# Patient Record
Sex: Female | Born: 1980 | Race: Black or African American | Hispanic: No | Marital: Single | Smoking: Current every day smoker
Health system: Southern US, Community
[De-identification: ages and names within clinical notes are randomized; demographics above are authoritative.]

## PROBLEM LIST (undated history)

## (undated) DIAGNOSIS — E7800 Pure hypercholesterolemia, unspecified: Secondary | ICD-10-CM

---

## 2008-06-06 ENCOUNTER — Encounter (HOSPITAL_BASED_OUTPATIENT_CLINIC_OR_DEPARTMENT_OTHER): Payer: Medicaid Other | Admitting: Physical Medicine & Rehabilitation

## 2008-06-06 DIAGNOSIS — M545 Low back pain, unspecified: Secondary | ICD-10-CM

## 2008-06-20 ENCOUNTER — Encounter (HOSPITAL_BASED_OUTPATIENT_CLINIC_OR_DEPARTMENT_OTHER): Payer: Medicaid Other | Admitting: Rehabilitative and Restorative Service Providers"

## 2008-06-28 ENCOUNTER — Encounter (HOSPITAL_BASED_OUTPATIENT_CLINIC_OR_DEPARTMENT_OTHER): Payer: Medicaid Other | Admitting: Rehabilitative and Restorative Service Providers"

## 2008-07-07 ENCOUNTER — Encounter (HOSPITAL_BASED_OUTPATIENT_CLINIC_OR_DEPARTMENT_OTHER): Payer: Medicaid Other | Admitting: Rehabilitative and Restorative Service Providers"

## 2008-07-12 ENCOUNTER — Encounter (HOSPITAL_BASED_OUTPATIENT_CLINIC_OR_DEPARTMENT_OTHER): Payer: Medicaid Other | Admitting: Rehabilitative and Restorative Service Providers"

## 2008-07-17 ENCOUNTER — Encounter (HOSPITAL_BASED_OUTPATIENT_CLINIC_OR_DEPARTMENT_OTHER): Payer: Medicaid Other | Admitting: Rehabilitative and Restorative Service Providers"

## 2008-07-26 ENCOUNTER — Encounter (HOSPITAL_BASED_OUTPATIENT_CLINIC_OR_DEPARTMENT_OTHER): Payer: Medicaid Other | Admitting: Rehabilitative and Restorative Service Providers"

## 2008-08-01 ENCOUNTER — Encounter (HOSPITAL_BASED_OUTPATIENT_CLINIC_OR_DEPARTMENT_OTHER): Payer: Medicaid Other | Admitting: Rehabilitative and Restorative Service Providers"

## 2008-08-01 ENCOUNTER — Encounter (HOSPITAL_BASED_OUTPATIENT_CLINIC_OR_DEPARTMENT_OTHER): Payer: Medicaid Other | Admitting: Physical Medicine & Rehabilitation

## 2008-08-01 DIAGNOSIS — M545 Low back pain, unspecified: Secondary | ICD-10-CM

## 2008-08-02 ENCOUNTER — Encounter (HOSPITAL_BASED_OUTPATIENT_CLINIC_OR_DEPARTMENT_OTHER): Payer: Self-pay | Admitting: Rehabilitative and Restorative Service Providers"

## 2008-08-09 ENCOUNTER — Encounter (HOSPITAL_BASED_OUTPATIENT_CLINIC_OR_DEPARTMENT_OTHER): Payer: Medicaid Other | Admitting: Rehabilitative and Restorative Service Providers"

## 2008-09-26 ENCOUNTER — Encounter (HOSPITAL_BASED_OUTPATIENT_CLINIC_OR_DEPARTMENT_OTHER): Payer: Medicaid Other | Admitting: Physical Medicine & Rehabilitation

## 2008-09-26 DIAGNOSIS — M545 Low back pain, unspecified: Secondary | ICD-10-CM

## 2011-10-05 ENCOUNTER — Emergency Department: Payer: Self-pay | Admitting: *Deleted

## 2012-12-31 ENCOUNTER — Emergency Department: Payer: Self-pay | Admitting: Emergency Medicine

## 2012-12-31 LAB — URINALYSIS, COMPLETE
Bilirubin,UR: NEGATIVE
Glucose,UR: NEGATIVE mg/dL (ref 0–75)
Ketone: NEGATIVE
Nitrite: NEGATIVE
RBC,UR: 2 /HPF (ref 0–5)
Specific Gravity: 1.002 (ref 1.003–1.030)

## 2013-01-04 ENCOUNTER — Emergency Department: Payer: Self-pay | Admitting: Emergency Medicine

## 2013-02-16 ENCOUNTER — Ambulatory Visit: Payer: Self-pay | Admitting: Obstetrics & Gynecology

## 2013-02-16 LAB — HEMOGLOBIN: HGB: 13 g/dL (ref 12.0–16.0)

## 2013-02-22 ENCOUNTER — Ambulatory Visit: Payer: Self-pay | Admitting: Obstetrics & Gynecology

## 2013-02-24 LAB — PATHOLOGY REPORT

## 2013-03-06 ENCOUNTER — Emergency Department: Payer: Self-pay | Admitting: Emergency Medicine

## 2013-03-06 LAB — URINALYSIS, COMPLETE
Bilirubin,UR: NEGATIVE
Nitrite: NEGATIVE
Ph: 7 (ref 4.5–8.0)
Protein: NEGATIVE
RBC,UR: 19 /HPF (ref 0–5)
Specific Gravity: 1.011 (ref 1.003–1.030)
Squamous Epithelial: 13
WBC UR: 3 /HPF (ref 0–5)

## 2013-03-06 LAB — PREGNANCY, URINE: Pregnancy Test, Urine: NEGATIVE m[IU]/mL

## 2013-03-06 LAB — COMPREHENSIVE METABOLIC PANEL
Alkaline Phosphatase: 95 U/L (ref 50–136)
BUN: 7 mg/dL (ref 7–18)
Co2: 27 mmol/L (ref 21–32)
Glucose: 103 mg/dL — ABNORMAL HIGH (ref 65–99)
SGPT (ALT): 35 U/L (ref 12–78)
Total Protein: 8.1 g/dL (ref 6.4–8.2)

## 2013-03-06 LAB — DRUG SCREEN, URINE
Amphetamines, Ur Screen: NEGATIVE (ref ?–1000)
Barbiturates, Ur Screen: NEGATIVE (ref ?–200)
Benzodiazepine, Ur Scrn: NEGATIVE (ref ?–200)
Cannabinoid 50 Ng, Ur ~~LOC~~: NEGATIVE (ref ?–50)
Cocaine Metabolite,Ur ~~LOC~~: NEGATIVE (ref ?–300)
Phencyclidine (PCP) Ur S: NEGATIVE (ref ?–25)
Tricyclic, Ur Screen: NEGATIVE (ref ?–1000)

## 2013-03-06 LAB — CBC
HCT: 40.3 % (ref 35.0–47.0)
HGB: 13.5 g/dL (ref 12.0–16.0)
MCH: 28.3 pg (ref 26.0–34.0)
MCHC: 33.5 g/dL (ref 32.0–36.0)
MCV: 84 fL (ref 80–100)
RDW: 13.9 % (ref 11.5–14.5)

## 2013-03-06 LAB — ETHANOL
Ethanol %: <0.003 % (ref 0.000–0.080)
Ethanol: <3 mg/dL

## 2013-03-06 LAB — TSH: Thyroid Stimulating Horm: 1.4 u[IU]/mL

## 2013-04-10 ENCOUNTER — Inpatient Hospital Stay: Payer: Self-pay | Admitting: Internal Medicine

## 2013-04-10 LAB — COMPREHENSIVE METABOLIC PANEL
Alkaline Phosphatase: 97 U/L (ref 50–136)
Anion Gap: 11 (ref 7–16)
BUN: 10 mg/dL (ref 7–18)
Bilirubin,Total: 0.6 mg/dL (ref 0.2–1.0)
Chloride: 101 mmol/L (ref 98–107)
Co2: 20 mmol/L — ABNORMAL LOW (ref 21–32)
Creatinine: 1.24 mg/dL (ref 0.60–1.30)
Osmolality: 265 (ref 275–301)
Potassium: 3.7 mmol/L (ref 3.5–5.1)
SGOT(AST): 18 U/L (ref 15–37)
SGPT (ALT): 31 U/L (ref 12–78)
Sodium: 132 mmol/L — ABNORMAL LOW (ref 136–145)
Total Protein: 8.3 g/dL — ABNORMAL HIGH (ref 6.4–8.2)

## 2013-04-10 LAB — DIFFERENTIAL
Basophil #: 0 10*3/uL (ref 0.0–0.1)
Basophil %: 0.2 %
Eosinophil #: 0.3 10*3/uL (ref 0.0–0.7)
Eosinophil %: 1.3 %
Lymphocyte #: 0.6 10*3/uL — ABNORMAL LOW (ref 1.0–3.6)
Lymphocyte %: 2.6 %
Monocyte #: 0.6 x10 3/mm (ref 0.2–0.9)

## 2013-04-10 LAB — URINALYSIS, COMPLETE
Bilirubin,UR: NEGATIVE
Glucose,UR: NEGATIVE mg/dL (ref 0–75)
Ketone: NEGATIVE
Nitrite: NEGATIVE
Ph: 7 (ref 4.5–8.0)
Protein: NEGATIVE
RBC,UR: 3 /HPF (ref 0–5)
Specific Gravity: 1.008 (ref 1.003–1.030)
Squamous Epithelial: 19
WBC UR: 1 /HPF (ref 0–5)

## 2013-04-10 LAB — LIPASE, BLOOD: Lipase: 272 U/L (ref 73–393)

## 2013-04-10 LAB — SEDIMENTATION RATE: Erythrocyte Sed Rate: 9 mm/hr (ref 0–20)

## 2013-04-10 LAB — WET PREP, GENITAL

## 2013-04-10 LAB — CK TOTAL AND CKMB (NOT AT ARMC)
CK, Total: 116 U/L (ref 21–215)
CK-MB: <0.5 ng/mL — ABNORMAL LOW (ref 0.5–3.6)

## 2013-04-10 LAB — CBC
HCT: 42.2 % (ref 35.0–47.0)
MCH: 27.6 pg (ref 26.0–34.0)
RDW: 13.7 % (ref 11.5–14.5)
WBC: 23.6 10*3/uL — ABNORMAL HIGH (ref 3.6–11.0)

## 2013-04-11 LAB — BASIC METABOLIC PANEL
Anion Gap: 10 (ref 7–16)
BUN: 9 mg/dL (ref 7–18)
Calcium, Total: 8.4 mg/dL — ABNORMAL LOW (ref 8.5–10.1)
Chloride: 109 mmol/L — ABNORMAL HIGH (ref 98–107)
Co2: 19 mmol/L — ABNORMAL LOW (ref 21–32)
Creatinine: 0.88 mg/dL (ref 0.60–1.30)
EGFR (African American): >60
Glucose: 162 mg/dL — ABNORMAL HIGH (ref 65–99)
Sodium: 138 mmol/L (ref 136–145)

## 2013-04-11 LAB — CBC WITH DIFFERENTIAL/PLATELET
Basophil #: 0.1 10*3/uL (ref 0.0–0.1)
Eosinophil #: 0.1 10*3/uL (ref 0.0–0.7)
Eosinophil %: 1 %
HCT: 36.6 % (ref 35.0–47.0)
Lymphocyte #: 0.4 10*3/uL — ABNORMAL LOW (ref 1.0–3.6)
Lymphocyte %: 3.7 %
MCH: 27.7 pg (ref 26.0–34.0)
MCHC: 34.1 g/dL (ref 32.0–36.0)
Monocyte #: 0.1 x10 3/mm — ABNORMAL LOW (ref 0.2–0.9)
Neutrophil %: 93.6 %
Platelet: 209 10*3/uL (ref 150–440)
WBC: 11.3 10*3/uL — ABNORMAL HIGH (ref 3.6–11.0)

## 2013-04-12 LAB — URINE CULTURE

## 2013-04-16 LAB — CULTURE, BLOOD (SINGLE)

## 2013-07-10 IMAGING — CT CT ABD-PELV W/ CM
1 of 2 series · 15 of 32 positions shown, 19 images · IV contrast (isovue)
Comparison: None

REASON FOR EXAM: (1) RLQ tenderness to palp; (2) RLQ tenderness to palp
COMMENTS:

PROCEDURE:     CT  - CT ABDOMEN / PELVIS  W  - April 10, 2013  [DATE]
RESULT:     History: Right lower quadrant tenderness
TECHNIQUE: Multiple axial images of the abdomen and pelvis were performed
from the lung bases to the pubic symphysis, without p.o. contrast and with
100 ml of Isovue 300 intravenous contrast.

[Series 2: 3mm soft tissue · axial · 0.66mm/px · z∈[-1050,-654]mm · 15 of 144 slices shown, 19 images]
[im 6/144  soft-tissue]
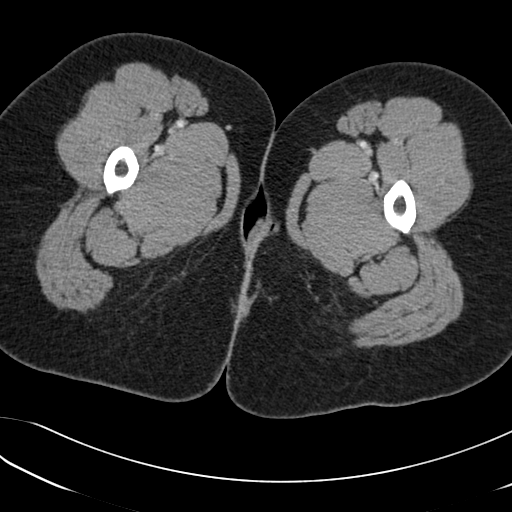
[im 6/144  bone]
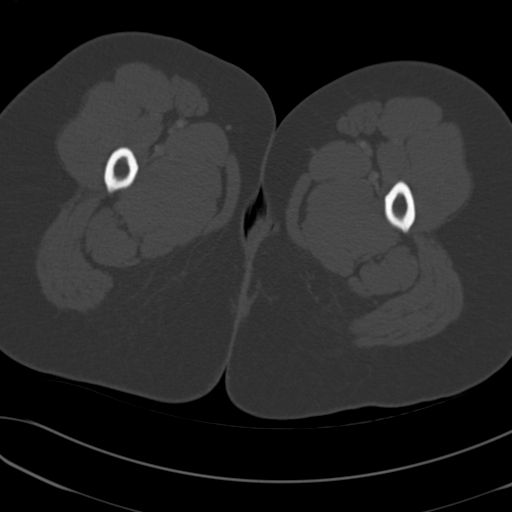
[im 18/144  soft-tissue]
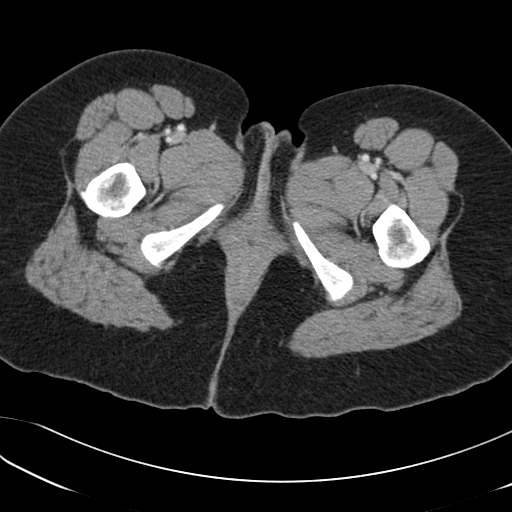
[im 30/144  soft-tissue]
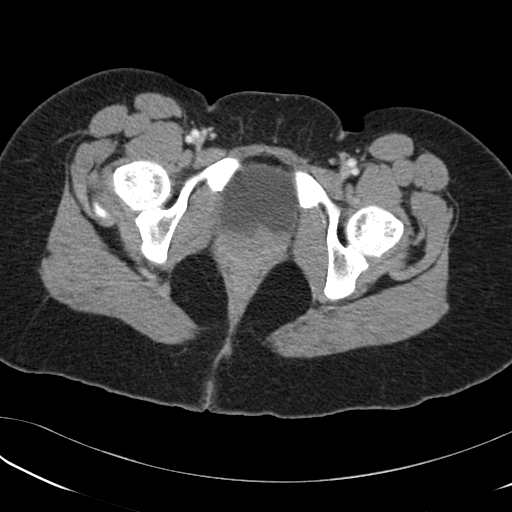
[im 42/144  soft-tissue]
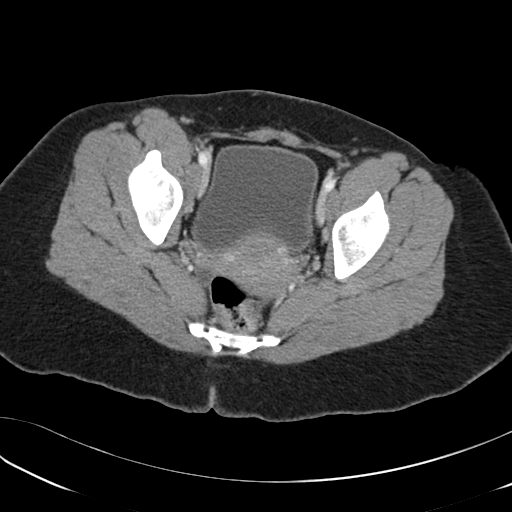
[im 48/144  soft-tissue]
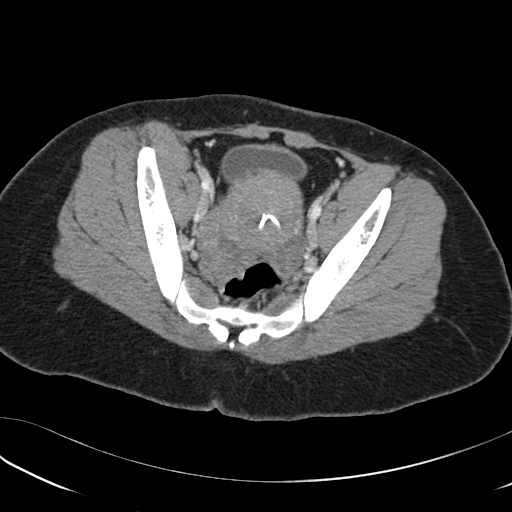
[im 60/144  soft-tissue]
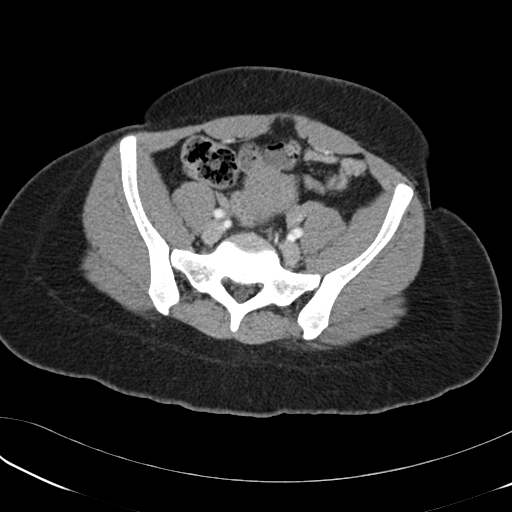
[im 72/144  soft-tissue]
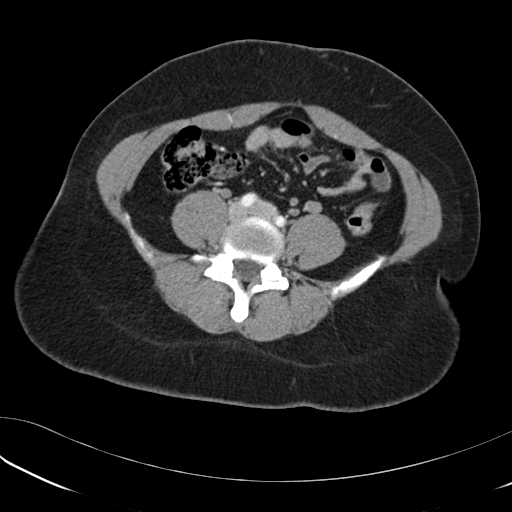
[im 84/144  soft-tissue]
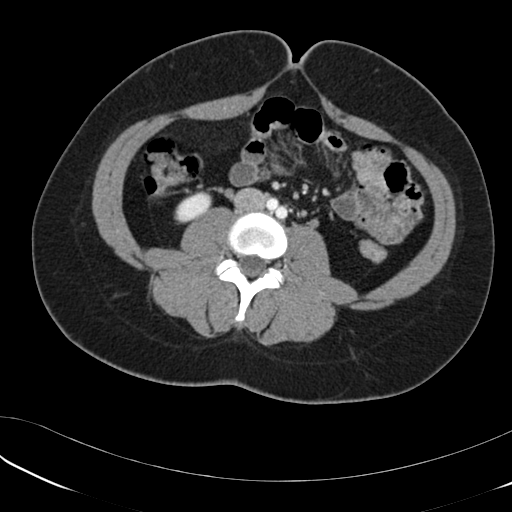
[im 96/144  soft-tissue]
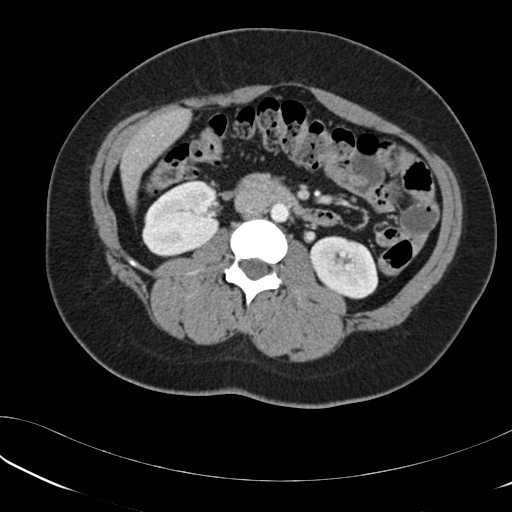
[im 96/144  bone]
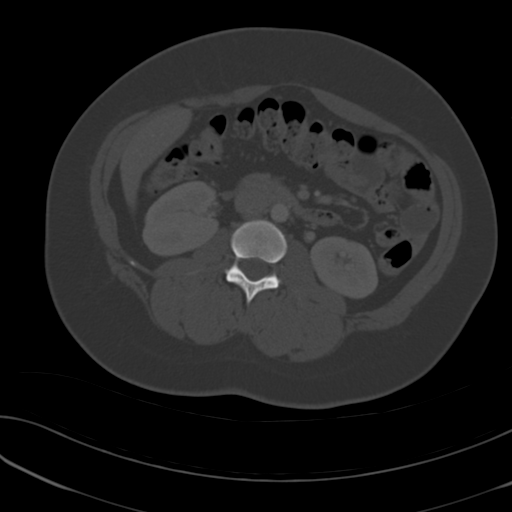
[im 102/144  soft-tissue]
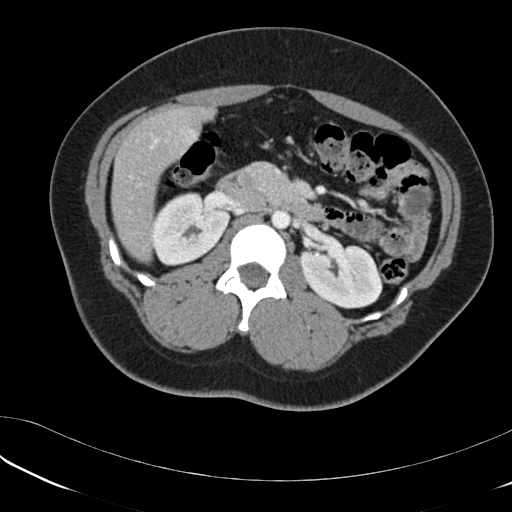
[im 114/144  soft-tissue]
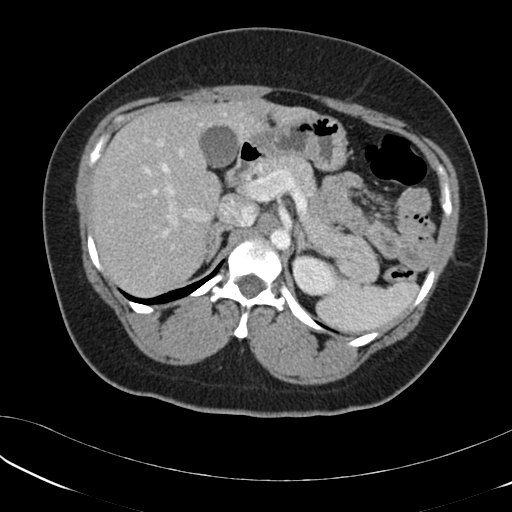
[im 120/144  lung]
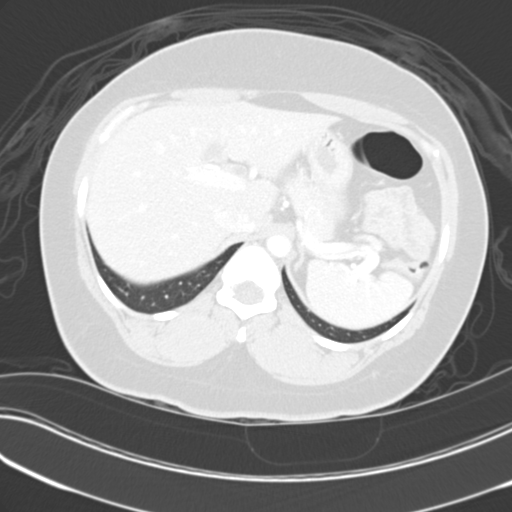
[im 126/144  soft-tissue]
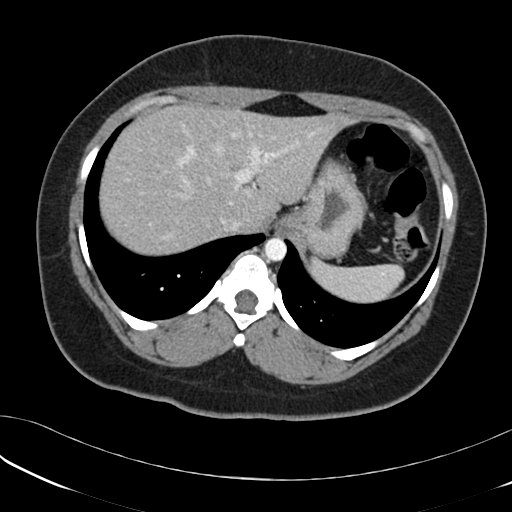
[im 126/144  lung]
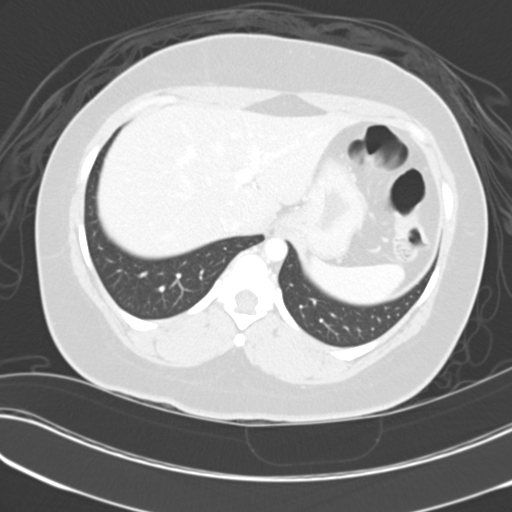
[im 132/144  lung]
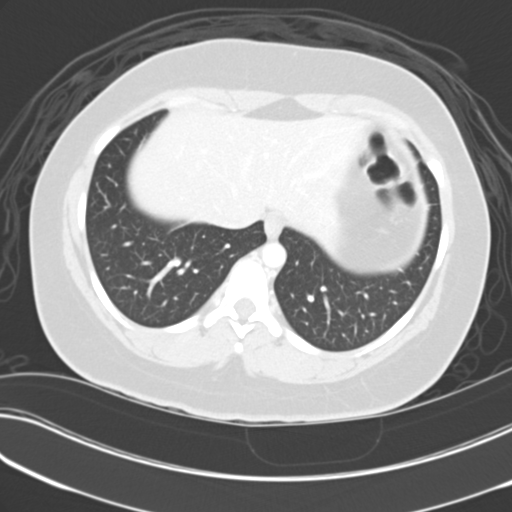
[im 138/144  soft-tissue]
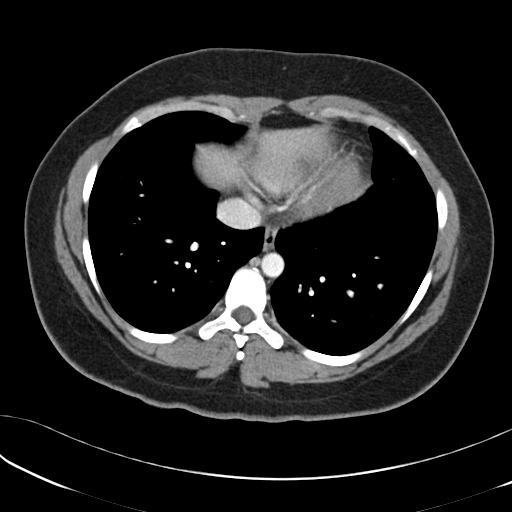
[im 138/144  lung]
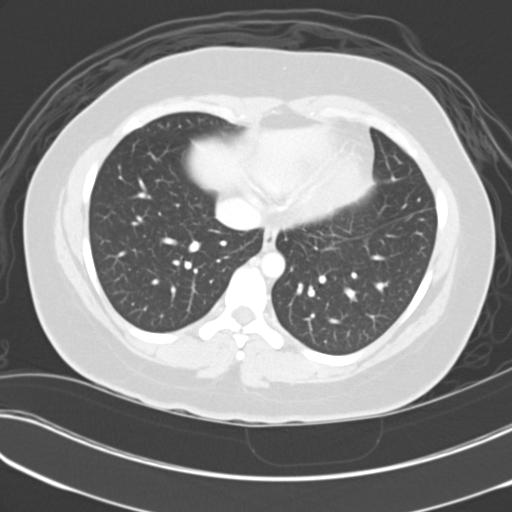

[15 of 32 positions shown; findings below may reference images not displayed]

FINDINGS: The lung bases are clear. There is no pneumothorax. The heart size is
normal.

The liver demonstrates no focal abnormality. There is no intrahepatic or
extrahepatic biliary ductal dilatation. The gallbladder is unremarkable. The
spleen demonstrates no focal abnormality. The kidneys, adrenal glands, and
pancreas are normal. The bladder is unremarkable.

The stomach, duodenum, small intestine, and large intestine demonstrate no
dilatation. There is a normal caliber appendix in the right lower quadrant
without periappendiceal inflammatory changes. There is no pneumoperitoneum,
pneumatosis, or portal venous gas. There is trace amount of pelvic free
fluid likely physiologic. There is no lymphadenopathy.

The abdominal aorta is normal in caliber .

The osseous structures are unremarkable.
IMPRESSION: 1. Normal Appendix.

[REDACTED]

## 2013-10-12 ENCOUNTER — Encounter (HOSPITAL_COMMUNITY): Payer: Self-pay | Admitting: Emergency Medicine

## 2013-10-12 ENCOUNTER — Emergency Department (HOSPITAL_COMMUNITY)
Admission: EM | Admit: 2013-10-12 | Discharge: 2013-10-13 | Disposition: A | Discharge status: Home / Self Care | Payer: Medicaid Other | Attending: Emergency Medicine | Admitting: Emergency Medicine

## 2013-10-12 ENCOUNTER — Emergency Department (HOSPITAL_COMMUNITY): Payer: Medicaid Other

## 2013-10-12 DIAGNOSIS — S3981XA Other specified injuries of abdomen, initial encounter: Secondary | ICD-10-CM | POA: Insufficient documentation

## 2013-10-12 DIAGNOSIS — Z862 Personal history of diseases of the blood and blood-forming organs and certain disorders involving the immune mechanism: Secondary | ICD-10-CM | POA: Insufficient documentation

## 2013-10-12 DIAGNOSIS — S0990XA Unspecified injury of head, initial encounter: Secondary | ICD-10-CM | POA: Insufficient documentation

## 2013-10-12 DIAGNOSIS — S46909A Unspecified injury of unspecified muscle, fascia and tendon at shoulder and upper arm level, unspecified arm, initial encounter: Secondary | ICD-10-CM | POA: Insufficient documentation

## 2013-10-12 DIAGNOSIS — T7421XA Adult sexual abuse, confirmed, initial encounter: Secondary | ICD-10-CM | POA: Insufficient documentation

## 2013-10-12 DIAGNOSIS — J029 Acute pharyngitis, unspecified: Secondary | ICD-10-CM | POA: Insufficient documentation

## 2013-10-12 DIAGNOSIS — IMO0002 Reserved for concepts with insufficient information to code with codable children: Secondary | ICD-10-CM

## 2013-10-12 DIAGNOSIS — R300 Dysuria: Secondary | ICD-10-CM | POA: Insufficient documentation

## 2013-10-12 DIAGNOSIS — F17200 Nicotine dependence, unspecified, uncomplicated: Secondary | ICD-10-CM | POA: Insufficient documentation

## 2013-10-12 DIAGNOSIS — S0993XA Unspecified injury of face, initial encounter: Secondary | ICD-10-CM | POA: Insufficient documentation

## 2013-10-12 DIAGNOSIS — Z79899 Other long term (current) drug therapy: Secondary | ICD-10-CM | POA: Insufficient documentation

## 2013-10-12 DIAGNOSIS — Z3202 Encounter for pregnancy test, result negative: Secondary | ICD-10-CM | POA: Insufficient documentation

## 2013-10-12 DIAGNOSIS — S4980XA Other specified injuries of shoulder and upper arm, unspecified arm, initial encounter: Secondary | ICD-10-CM | POA: Insufficient documentation

## 2013-10-12 DIAGNOSIS — Z8639 Personal history of other endocrine, nutritional and metabolic disease: Secondary | ICD-10-CM | POA: Insufficient documentation

## 2013-10-12 HISTORY — DX: Pure hypercholesterolemia, unspecified: E78.00
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
## 2013-10-12 NOTE — ED Notes (Signed)
All clothing taken by detective,pt advocate at bedside.

## 2013-10-13 LAB — POCT PREGNANCY, URINE: Preg Test, Ur: NEGATIVE

## 2013-10-13 MED ORDER — METRONIDAZOLE 500 MG PO TABS
2000.0000 mg | ORAL_TABLET | Freq: Once | ORAL | Status: AC
  Administered 2013-10-13: 2000 mg via ORAL

## 2013-10-13 MED ORDER — LEVONORGESTREL 0.75 MG PO TABS
1.5000 mg | ORAL_TABLET | Freq: Once | ORAL | Status: AC
  Administered 2013-10-13: 1.5 mg via ORAL
  Filled 2013-10-13: qty 2

## 2013-10-13 MED ORDER — CEFIXIME 400 MG PO TABS
400.0000 mg | ORAL_TABLET | Freq: Once | ORAL | Status: AC
  Administered 2013-10-13: 400 mg via ORAL
  Filled 2013-10-13: qty 1

## 2013-10-13 MED ORDER — PROMETHAZINE HCL 25 MG PO TABS
25.0000 mg | ORAL_TABLET | Freq: Four times a day (QID) | ORAL | Status: DC | PRN
  Administered 2013-10-13: 75 mg via ORAL
  Administered 2013-10-13: 25 mg via ORAL
  Filled 2013-10-13: qty 1

## 2013-10-13 MED ORDER — AZITHROMYCIN 1 G PO PACK
1.0000 g | PACK | Freq: Once | ORAL | Status: AC
  Administered 2013-10-13: 1 g via ORAL

## 2013-10-13 NOTE — ED Provider Notes (Signed)
Medical screening examination/treatment/procedure(s) were performed by non-physician practitioner and as supervising physician I was immediately available for consultation/collaboration.  EKG Interpretation   None        Shon Baton, MD 10/13/13 [phone]

## 2013-10-13 NOTE — ED Notes (Addendum)
Sane Nurse at bedside now and will be transporting  patient upstair now.
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
## 2013-10-14 ENCOUNTER — Emergency Department (HOSPITAL_COMMUNITY)
Admission: EM | Admit: 2013-10-14 | Discharge: 2013-10-14 | Disposition: A | Discharge status: Home / Self Care | Payer: Medicaid Other | Attending: Emergency Medicine | Admitting: Emergency Medicine

## 2013-10-14 ENCOUNTER — Encounter (HOSPITAL_COMMUNITY): Payer: Self-pay | Admitting: Emergency Medicine

## 2013-10-14 DIAGNOSIS — Z888 Allergy status to other drugs, medicaments and biological substances status: Secondary | ICD-10-CM | POA: Insufficient documentation

## 2013-10-14 DIAGNOSIS — E7800 Pure hypercholesterolemia, unspecified: Secondary | ICD-10-CM | POA: Insufficient documentation

## 2013-10-14 DIAGNOSIS — F17200 Nicotine dependence, unspecified, uncomplicated: Secondary | ICD-10-CM | POA: Insufficient documentation

## 2013-10-14 DIAGNOSIS — Z3202 Encounter for pregnancy test, result negative: Secondary | ICD-10-CM | POA: Insufficient documentation

## 2013-10-14 DIAGNOSIS — N390 Urinary tract infection, site not specified: Secondary | ICD-10-CM | POA: Insufficient documentation

## 2013-10-14 DIAGNOSIS — Z79899 Other long term (current) drug therapy: Secondary | ICD-10-CM | POA: Insufficient documentation

## 2013-10-14 LAB — COMPREHENSIVE METABOLIC PANEL
ALT: 40 U/L — ABNORMAL HIGH (ref 0–35)
AST: 35 U/L (ref 0–37)
CO2: 26 mEq/L (ref 19–32)
Chloride: 100 mEq/L (ref 96–112)
Creatinine, Ser: 0.87 mg/dL (ref 0.50–1.10)
GFR calc non Af Amer: 88 mL/min — ABNORMAL LOW (ref >90)
Glucose, Bld: 93 mg/dL (ref 70–99)
Sodium: 137 mEq/L (ref 135–145)
Total Bilirubin: 0.2 mg/dL — ABNORMAL LOW (ref 0.3–1.2)

## 2013-10-14 LAB — CBC
MCH: 28.9 pg (ref 26.0–34.0)
MCHC: 34.7 g/dL (ref 30.0–36.0)
Platelets: 263 10*3/uL (ref 150–400)

## 2013-10-14 LAB — URINALYSIS, ROUTINE W REFLEX MICROSCOPIC
Bilirubin Urine: NEGATIVE
Glucose, UA: NEGATIVE mg/dL
Ketones, ur: NEGATIVE mg/dL
Specific Gravity, Urine: 1.012 (ref 1.005–1.030)
pH: 7.5 (ref 5.0–8.0)

## 2013-10-14 LAB — URINE MICROSCOPIC-ADD ON

## 2013-10-14 LAB — POCT PREGNANCY, URINE: Preg Test, Ur: NEGATIVE

## 2013-10-14 MED ORDER — CEPHALEXIN 250 MG PO CAPS
500.0000 mg | ORAL_CAPSULE | Freq: Once | ORAL | Status: AC
  Administered 2013-10-14: 500 mg via ORAL
  Filled 2013-10-14: qty 2

## 2013-10-14 MED ORDER — IBUPROFEN 400 MG PO TABS
600.0000 mg | ORAL_TABLET | Freq: Once | ORAL | Status: AC
  Administered 2013-10-14: 18:00:00 600 mg via ORAL
  Filled 2013-10-14 (×2): qty 1

## 2013-10-14 MED ORDER — CEPHALEXIN 500 MG PO CAPS: 500.0000 mg | ORAL_CAPSULE | Freq: Two times a day (BID) | ORAL | Status: AC
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
## 2013-10-15 LAB — URINE CULTURE

## 2013-10-15 NOTE — ED Provider Notes (Signed)
Medical screening examination/treatment/procedure(s) were conducted as a shared visit with resident physician and myself.  I personally evaluated the patient during the encounter.  I interviewed and examined the patient. Lungs are CTAB. Cardiac exam wnl. Abdomen soft.  Will tx for UTI. Return for worsening.   Junius Argyle, MD 10/15/13 [phone]

## 2013-12-21 ENCOUNTER — Other Ambulatory Visit (HOSPITAL_BASED_OUTPATIENT_CLINIC_OR_DEPARTMENT_OTHER): Payer: Self-pay | Admitting: Pulmonary Disease

## 2013-12-21 ENCOUNTER — Emergency Department (HOSPITAL_BASED_OUTPATIENT_CLINIC_OR_DEPARTMENT_OTHER)
Admission: EM | Admit: 2013-12-21 | Discharge: 2013-12-21 | Disposition: A | Discharge status: Home / Self Care | Payer: No Typology Code available for payment source | Attending: Emergency Medicine | Admitting: Emergency Medicine

## 2013-12-21 DIAGNOSIS — F411 Generalized anxiety disorder: Secondary | ICD-10-CM

## 2013-12-21 DIAGNOSIS — IMO0001 Reserved for inherently not codable concepts without codable children: Secondary | ICD-10-CM

## 2015-04-06 NOTE — H&P (Signed)
PATIENT NAME:  Jordan Swanson, Jordan Swanson MR#:  [id number] DATE OF BIRTH:  [birth year]  DATE OF ADMISSION:  04/10/2013  REFERRING PHYSICIAN: Dr. Reita Cliche   PRIMARY CARE PHYSICIAN: Dr.  Brunetta Genera   CHIEF COMPLAINT: Urinary tract infection symptoms.   HISTORY OF PRESENT ILLNESS: The patient is a pleasant, a 34 year old African American female with history of MR, chronic back pain and a recent D and C in March. She had complaint of some urinary tract infection symptoms including frequency and dysuria and PCP clinic and was started on a Bactrim yesterday. She has taken 3 doses of Bactrim. After taking Bactrim, she started to have a headache in diffuse body pains, injected sclerae and felt unwell. The patient presented to the hospital today and was noted to have of rash. She lives in a group home. She does have elevated white count and a fever. Bactrim has been stopped and added  to her allergy list. She has white count of 23,000 and she underwent a CAT scan of the abdomen and pelvis, which did not show any significant issues as she was complaining of right lower quadrant abdominal pain and no appendicitis was seen. Also a pelvic exam was done and wet prep does not show any significant findings at this point. Hospitalist services were contacted for further evaluation and management.   PAST MEDICAL HISTORY:  1.  MR .  2.  History of MRSA several years ago.  3.  Chronic back pain.  4.  Seasonal allergies.  5.  History of cesarean section.  6.  History of recent urinary tract infections.  7.  History of recent dilation and curettage.   ALLERGIES: BENADRYL, BACTRIM, PORK WHICH IS NOT AN ALLERGY BUT AN INTOLERANCE,  SHELLFISH UNKNOWN ALLERGY.   OUTPATIENT MEDICATIONS: Bactrim 800/160 mg 1 tab 2 times a day started yesterday today is day 3,  loratadine 10 mg 1 tab once a day.   FAMILY HISTORY: Mom with cancer. Dad with epilepsy.   SOCIAL HISTORY: Currently lives in a group home, no tobacco, but before she did smoke,  occasional alcohol. No drug use, but prior she has used several drugs in the past.   REVIEW OF SYSTEMS: CONSTITUTIONAL: Positive for fever, fatigue, poor, global weakness. EYES:  Did have blurry vision yesterday.  ENT: No tinnitus or hearing loss.  RESPIRATORY: No cough, has some painful respiration. No dyspnea on exertion or shortness of breath.  CARDIOVASCULAR: No chest pain, orthopnea or edema.  GASTROINTESTINAL: No nausea, vomiting, had right lower quadrant abdominal pain. No hematemesis, melena or dark stools.  GENITOURINARY: Still has occasional dysuria and frequency.  HEMATOLOGIC/LYMPHATIC:  No anemia or easy bruising.  SKIN: A rash, which the patient thinks started yesterday.  MUSCULOSKELETAL: Has some joint and muscle pains, which is diffuse as well as.  NEUROLOGIC: No focal numbness or weakness. No stroke.  PSYCHIATRIC: No anxiety or insomnia.   PHYSICAL EXAMINATION: VITAL SIGNS: Temperature noted to be 100.9 on arrival; initial heart rate 120, respiratory rate 20, blood pressure 132/79 and oxygen saturation 100% on room air.  GENERAL: The patient as well developed, obese African American female lying in bed in no obvious distress.  HEENT: Normocephalic, atraumatic. Pupils are equal and reactive. Injected sclerae. Extraocular muscles intact. Moist mucous membranes.  NECK: Supple. No thyroid tenderness. No cervical lymphadenopathy.  CARDIOVASCULAR: S1, S2 regular rate and rhythm. No significant murmurs or rubs appreciated.  LUNGS: Clear to auscultation without wheezing or rhonchi.  ABDOMEN: Soft, nontender, nondistended. Positive bowel sounds.  EXTREMITIES: No  significant lower extremity edema.  SKIN:  The patient is a diffuse, very fine maculopapular rash, more on the lower extremities. They go to the mid-thigh and upper extremities; they go slightly above the elbows, more anteriorly.  On the abdomen, below the mid thorax mostly, nothing on the back.    NEUROLOGIC: Cranial nerves  II through XII grossly intact. Strength is 5/5 in all extremities. Sensation is intact to light touch.  PSYCHIATRIC: Awake, alert, oriented x 3.   LABORATORY, DIAGNOSTIC AND RADIOLOGIC DATA: Glucose 128, BUN 10, creatinine 1.24, sodium 132, potassium 3.7.  LFTs: Total protein 8.3, otherwise within normal limits. Lipase 272, CK-MB less than 0.5, CK totals 116. WBC 23.68, SR 9, hemoglobin 14.2, platelets 264.   Wet prep: A few white blood cells, no trichomonas, spermatozoa, yeast or clue cells seen.   UA: 3 RBCs, 1 WBC, trace bacteria, trace leukocyte esterase, no nitrites.  CAT scan of abdomen and pelvis as above.  X-ray of the chest done, it is not uploaded in the chart and not read yet.   ASSESSMENT AND PLAN: We have a 34 year old  [place] American female with MR, with recent urinary tract infections.  She was started on Bactrim, today is day 2 with body pain, headache, fever and a rash after taking Bactrim with persistent urinary tract infection symptoms. The patient is noted to have significant systemic inflammatory response syndrome criteria thought of possible etiology being  from urinary tract infection versus also a drug reaction and reactive in nature from Bactrim. Blood and urine cultures have been ordered and pending. We stopped the Bactrim and started the patient on Levaquin and wait for the urine cultures blood urinalysis is negative, which could be setting of Bactrim as the patient has had partial treatment thus far. CT of abdomen and pelvis is negative. She has a negative ESR as well and had no elevation of CK total are suggest she has myositis. For the rash, we will start the patient on Solu-Medrol and Zantac. No Benadryl as she is allergic to with. The rash could be from the Bactrim. It is diffuse, maculopapular and fine in nature. We will monitor the rash. Her MR appears to be stable. The patient has mild hyponatremia and would start her on normal saline. We will follow her  clinically.   CODE STATUS: Full Code.   TOTAL TIME SPENT: 55 minutes.    ____________________________ Vivien Presto, MD sa:cc D: 04/10/2013 19:01:09 ET T: 04/10/2013 19:16:03 ET JOB#: [id number]  cc: Vivien Presto, MD, <Dictator> Meindert A. Brunetta Genera, MD Vivien Presto MD ELECTRONICALLY SIGNED 05/09/2013 13:54

## 2015-04-06 NOTE — Op Note (Signed)
PATIENT NAME:  Ignatius SpeckingJACOB, Yamina MR#:  [id number] DATE OF BIRTH:  [birth year]  DATE OF PROCEDURE:  02/22/2013  PREOPERATIVE DIAGNOSES:  1. Missed abortion.  2. Undesired fertility.   POSTOPERATIVE DIAGNOSES:  1. Missed abortion. 2. Undesired fertility.   PROCEDURE: Suction dilation and curettage and Mirena IUD insertion.   INDICATIONS: A 34 year old with a 7-week missed AB who had undesired fertility.   SURGEON: Verlin GrillsEryn Stansbury Clipp, MD   ANESTHESIA: General.   ESTIMATED BLOOD LOSS: 25 mL.   IV FLUIDS: 600 mL crystalloid.   URINE OUTPUT: 50 mL clear yellow urine prior to procedure.   COMPLICATIONS: None.   FINDINGS: An 8-week size anteverted uterus sounded to 9 cm.   MEDICATIONS: Doxycycline 100 mg prior to procedure and 200 mg prescription doxycycline given to take following.   PROCEDURE IN DETAIL: The patient was taken to the operating room. She was given anesthesia via general. She had her legs placed in candy cane stirrups. She was prepped and draped in standard sterile fashion. An exam under anesthesia revealed the findings above. A sterile speculum was inserted into the vagina.  The uterine cervix was grasped with a single-tooth tenaculum anteriorly. The uterus was sounded to 9 cm. The cervix was serially dilated up to 24-French , and then an 8 mm curved suction curette was inserted into the uterus with removal of products of conception. The suction curette was removed and sharp curettage was performed removing further endometrial curettings, followed by once more suction curettage with minimal return. The curettes were removed from the uterus, and the sterile Mirena IUD was placed without difficulty into the uterine cavity. The inserter was removed. The strings were cut to approximately 4 cm long.  The tenaculum was removed from the cervix. Hemostasis was noted to be excellent. The speculum was removed. All counts were correct x 2. The patient tolerated the procedure well and was taken  to the PACU in stable condition.   ____________________________ Ali LoweEryn K. Garnette GunnerStansbury Clipp, MD eks:cb D: 02/22/2013 17:09:27 ET T: 02/22/2013 18:10:13 ET JOB#: [id number]  cc: Weston SettleEryn K. Garnette GunnerStansbury Clipp, MD, <Dictator> Weston SettleERYN Lona KettleK STANSBURY CLIPP MD ELECTRONICALLY SIGNED 02/23/2013 7:55
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
## 2015-05-26 ENCOUNTER — Emergency Department
Admission: EM | Admit: 2015-05-26 | Discharge: 2015-05-26 | Disposition: A | Discharge status: Home / Self Care | Payer: No Typology Code available for payment source | Attending: Emergency Medicine | Admitting: Emergency Medicine

## 2015-05-26 DIAGNOSIS — B0089 Other herpesviral infection: Secondary | ICD-10-CM | POA: Insufficient documentation

## 2015-05-26 DIAGNOSIS — R21 Rash and other nonspecific skin eruption: Secondary | ICD-10-CM

## 2015-05-26 DIAGNOSIS — R05 Cough: Secondary | ICD-10-CM

## 2015-05-26 DIAGNOSIS — J069 Acute upper respiratory infection, unspecified: Secondary | ICD-10-CM | POA: Insufficient documentation

## 2015-05-27 LAB — HSV SEMIQNT RAPID PCR, SWAB/CSF
Rapid HSV 1 PCR Result: NOT DETECTED
Rapid HSV 2 PCR Result: POSITIVE — AB

## 2015-05-28 LAB — WOUND C/S W/GRAM
Culture: NO GROWTH
Gram Smear: NONE SEEN

## 2015-06-02 ENCOUNTER — Emergency Department
Admission: EM | Admit: 2015-06-02 | Discharge: 2015-06-02 | Disposition: A | Discharge status: Home / Self Care | Payer: No Typology Code available for payment source | Attending: Emergency Medicine | Admitting: Emergency Medicine

## 2015-06-02 DIAGNOSIS — R05 Cough: Secondary | ICD-10-CM

## 2015-06-02 DIAGNOSIS — J209 Acute bronchitis, unspecified: Secondary | ICD-10-CM | POA: Insufficient documentation

## 2015-06-02 DIAGNOSIS — R1013 Epigastric pain: Secondary | ICD-10-CM

## 2015-06-02 DIAGNOSIS — H9209 Otalgia, unspecified ear: Secondary | ICD-10-CM

## 2015-06-02 DIAGNOSIS — R109 Unspecified abdominal pain: Secondary | ICD-10-CM | POA: Insufficient documentation

## 2015-06-02 LAB — CBC, DIFF
% Basophils: 0 %
% Eosinophils: 0 %
% Immature Granulocytes: 1 %
% Lymphocytes: 14 %
% Monocytes: 4 %
% Neutrophils: 81 %
% Nucleated RBC: 0 %
Absolute Eosinophil Count: 0.06 10*3/uL (ref 0.00–0.50)
Absolute Lymphocyte Count: 2.09 10*3/uL (ref 1.00–4.80)
Basophils: 0.02 10*3/uL (ref 0.00–0.20)
Hematocrit: 40 % (ref 36–45)
Hemoglobin: 12.8 g/dL (ref 11.5–15.5)
Immature Granulocytes: 0.09 10*3/uL — ABNORMAL HIGH (ref 0.00–0.05)
MCH: 25.3 pg — ABNORMAL LOW (ref 27.3–33.6)
MCHC: 32.4 g/dL (ref 32.2–36.5)
MCV: 78 fL — ABNORMAL LOW (ref 81–98)
Monocytes: 0.68 10*3/uL (ref 0.00–0.80)
Neutrophils: 12.35 10*3/uL — ABNORMAL HIGH (ref 1.80–7.00)
Nucleated RBC: 0 10*3/uL
Platelet Count: 353 10*3/uL (ref 150–400)
RBC: 5.06 10*6/uL — ABNORMAL HIGH (ref 3.80–5.00)
RDW-CV: 15.2 % — ABNORMAL HIGH (ref 11.6–14.4)
WBC: 15.29 10*3/uL — ABNORMAL HIGH (ref 4.3–10.0)

## 2015-06-02 LAB — URINALYSIS WITH REFLEX CULTURE
Bilirubin (Qual), URN: NEGATIVE
Epith Cells_Renal/Trans,URN: NEGATIVE /HPF
Glucose Qual, URN: NEGATIVE mg/dL
Ketones, URN: 5 mg/dL — AB
Leukocyte Esterase, URN: NEGATIVE
Nitrite, URN: NEGATIVE
Occult Blood, URN: NEGATIVE
Protein (Alb Semiquant), URN: NEGATIVE mg/dL
RBC, URN: NEGATIVE /HPF
Specific Gravity, URN: >1.029 g/mL — ABNORMAL HIGH (ref 1.006–1.029)
WBC, URN: NEGATIVE /HPF
pH, URN: 6 (ref 5.0–8.0)

## 2015-06-02 LAB — COMPREHENSIVE METABOLIC PANEL
ALT (GPT): 54 U/L — ABNORMAL HIGH (ref 7–33)
AST (GOT): 34 U/L (ref 9–38)
Albumin: 3.9 g/dL (ref 3.5–5.2)
Alkaline Phosphatase (Total): 154 U/L — ABNORMAL HIGH (ref 25–100)
Anion Gap: 7 (ref 4–12)
Bilirubin (Total): 0.3 mg/dL (ref 0.2–1.3)
Calcium: 9.2 mg/dL (ref 8.9–10.2)
Carbon Dioxide, Total: 24 meq/L (ref 22–32)
Chloride: 105 meq/L (ref 98–108)
Creatinine: 0.79 mg/dL (ref 0.38–1.02)
GFR, Calc, African American: >60 mL/min (ref >59)
GFR, Calc, European American: >60 mL/min (ref >59)
Glucose: 155 mg/dL — ABNORMAL HIGH (ref 62–125)
Potassium: 3.8 meq/L (ref 3.6–5.2)
Protein (Total): 7.7 g/dL (ref 6.0–8.2)
Sodium: 136 meq/L (ref 135–145)
Urea Nitrogen: 6 mg/dL — ABNORMAL LOW (ref 8–21)

## 2015-06-02 LAB — PREGNANCY (HCG), SERUM, QUANT: Pregnancy (HCG), SRM: <1 m[IU]/mL (ref <6)

## 2015-06-02 LAB — TROPONIN_I
Troponin_I Interpretation: NORMAL
Troponin_I: <0.03 ng/mL (ref <0.04)

## 2015-06-02 LAB — LAB ADD ON ORDER

## 2015-06-02 LAB — LIPASE: Lipase: 30 U/L (ref <70)

## 2015-12-28 ENCOUNTER — Telehealth (INDEPENDENT_AMBULATORY_CARE_PROVIDER_SITE_OTHER): Payer: Self-pay

## 2015-12-28 NOTE — Telephone Encounter (Signed)
Pt states she had her Nexplanon removed on 1/6 and has been having sex continuously day and night since then.  Pt wanted to know if she could get pregnancy.  I advised pt that per the maker of Nexplanon in clinical trials pregnancies were observed to occur as early as 7-14 days after removal.  I asked pt is she was trying to get pregnant and she said someday but not now.  I advised pt then she needs to consider birth control or she could get pregnant.  Pt agreed with plan and will contact [facility].

## 2015-12-28 NOTE — Telephone Encounter (Signed)
pt had implant from arm removed 12/21/15 (pt not certain of name). [facility] [place] [address]. Pt had it [facility] 2years ago(pt not certain[facility] [place].   Her question is if she could be pregnant because she has had intercourse since implant was removed and her period has not arrived?    Informed pt that she could call the nurses line on the back of her Apple card and pt stated she didn't know where it was at.    Please call back and advise.

## 2017-09-23 NOTE — Progress Notes (Signed)
CC: glaucoma evaluation    HISTORY OF PRESENT ILLNESS:  36 year old female presents after referral from [facility] for glaucoma evaluation.  Exotropia and BCVA of 20/25 and 20/30 were also noted on exam.  She notes a long standing h/o diplopia.    REVIEW OF SYSTEMS:  Comprehensive review of systems was performed.   All systems were negative except as noted in the past medical history and HPI above.    PAST OCULAR HISTORY:  exotropia    PAST MEDICAL HISTORY:  Depression   High cholesterol    Social History     Social History    Marital status: Single     Spouse name: N/A    Number of children: N/A    Years of education: N/A     Occupational History    Not on file.     Social History Main Topics    Smoking status: Not on file    Smokeless tobacco: Not on file    Alcohol use Not on file    Drug use: Unknown    Sexual activity: Not on file     Other Topics Concern    Not on file     Social History Narrative    No narrative on file       FAMILY HISTORY:  Exotropia    OPHTH AND OTHER SELECTED MEDS:  Denies drops    ALLERGY:  Bactrim; Benadryl; PCN; "antibotics" - she doesn't know which ones    PHYSICAL EXAM:   Eyes: See Eye Exam  Constitutional: Notable for the following: Well developed, appearing stated age and in no acute distress  Psychiatric: Mood: normal mood  Neurologic: face is symmetric, gait is independent  Musculoskeletal: head is normocepalic, no skeletal abnormalities of upper extremities  Skin: No facial rashes  ZOX:WRUEAVWU ears and nose are normal in appearance   Respiratory:normal respiratory effort    CVF: I personally performed the CVF and agree with the technician's assessment.  EOMs: I personally performed the EOMs and agree with the technician's assessment.    HVF 24-2 09/24/17  OD:  Quality:   Poor (3/13 fixation losses)  MD:   -14.13 dB  Interpretation:  Inferior defects; high false positives     OS:  Quality:   Fair (3/10 fixation losses)  MD:   -1.35  dB  Interpretation:  Full but unreliable  Interval change: baseline     Pachymetry 09/24/17  OD: 517 um  OS: 524 um   Thin OU  Interval change: baseline    OCT of OPTIC NERVES 09/24/17  OD:   Centration:  Good  Thinning:  SN, nasal, global  Average thickness: 79 um  OS:  Centration:  Good  Thinning:  TS, temporal, global  Average thickness: 78 um  Interval change: baseline    OCT of macula 09/24/17  Normal foveal contour OU    A/P:    1. Glaucoma suspect due to cupping  Thin corneas  OCT with thinning both eyes (temporally OS)  Follow-up for HVF and gonio    2. Exotropia  Strabismus evaluation       Referring:  [facility]  [address]  [place], [place] [address]    PCP:  Unknown PCP   A Patient Who Has A Pcp But Is Unsure Of The Name

## 2017-09-24 ENCOUNTER — Ambulatory Visit (HOSPITAL_BASED_OUTPATIENT_CLINIC_OR_DEPARTMENT_OTHER): Payer: No Typology Code available for payment source | Attending: Ophthalmology | Admitting: Ophthalmology

## 2017-09-24 DIAGNOSIS — H40003 Preglaucoma, unspecified, bilateral: Secondary | ICD-10-CM | POA: Insufficient documentation

## 2017-09-24 DIAGNOSIS — H5010 Unspecified exotropia: Secondary | ICD-10-CM

## 2017-09-24 NOTE — Progress Notes (Signed)
HVF 24-2 sita std performed in the office today, both eyes.

## 2017-09-24 NOTE — Patient Instructions (Signed)
Thank you for coming in today.  During today's visit we reviewed only your ophthalmology (eye-related) medications.  Please follow up with your primary care provider for any questions regarding other medications.    If your eyes were dilated during today's visit, the average dilation will last 4 to 6 hours and may impact your overall vision during this period. Please use caution during this period.    If you need to schedule or change a follow up appointment, please call [phone].  Our phone lines are open 7:00am to 8:00pm Monday through Saturdays and 9:00am to 5:30pm on Sundays.

## 2017-09-24 NOTE — Progress Notes (Signed)
OCT scans of optic nerve done in both eyes in clinic. Images stored in [facility] PACS.

## 2017-12-28 ENCOUNTER — Ambulatory Visit (HOSPITAL_BASED_OUTPATIENT_CLINIC_OR_DEPARTMENT_OTHER): Payer: No Typology Code available for payment source | Attending: Ophthalmology | Admitting: Ophthalmology

## 2017-12-28 DIAGNOSIS — H5010 Unspecified exotropia: Secondary | ICD-10-CM | POA: Insufficient documentation

## 2017-12-28 NOTE — Progress Notes (Signed)
CC:    Chief Complaint   Patient presents with    Strabismus     Referred by Dr. Lonn GeorgiaKo for eval of XT       HPI:  37 year old female seen at the request of Dr. Lonn GeorgiaKo for evaluation of exotropia.   She arrived 50 minutes late for the appointment.  She reports she is about [redacted] weeks pregnant, based on home pregnancy test.    She was initially seen by Dr. Lonn GeorgiaKo in October 2018 for glaucoma eval. She noted a longstanding exotropia and referred for further evaluation.    She reports only rare diplopia. She is bothered by difficulty with eye contact. She freely alternates between the two eyes.    Drivers license from [id number] shows a large angle left exotropia.    Work-Up: CT max/face 11/22/16: Mildly displaced left nasal fracture.    Review of systems:    Comprehensive review of systems performed.  Scanned into EMR.  Negative except as noted.    Medical, surgical, family history reviewed today per questionnaire (scanned into EMR).    No past medical history on file.    Outpatient Medications Prior to Visit   Medication Sig Dispense Refill    Acetaminophen 500 MG Oral Tab Take 1,000 mg by mouth.      Ibuprofen 600 MG Oral Tab Take 600 mg by mouth.      Ibuprofen 800 MG Oral Tab       Levonorgestrel (Mirena) 52 mg Intrauterine Device Insert 1 each into the uterus.      Loperamide HCl 2 MG Oral Cap Take 2 mg by mouth.      MetroNIDAZOLE 500 MG Oral Tab TK 1 T PO Q 12 H FOR 7 DAYS  0    Naproxen 250 MG Oral Tab Take 250 mg by mouth.      Ondansetron 4 MG Oral TABLET DISPERSIBLE Dissolve 4-8 mg on top of tongue and swallow.      Ondansetron 8 MG Oral TABLET DISPERSIBLE Dissolve 8 mg on top of tongue and swallow.      RaNITidine HCl 150 MG Oral Tab TK 1 T PO BID FOR 5 DAYS  2    Triamcinolone Acetonide 0.1 % External Ointment Apply 1 application topically.      ValACYclovir HCl 1 g Oral Tab Take 1,000 mg by mouth.       No facility-administered medications prior to visit.              Social History     Social History    Marital  status: Single     Spouse name: N/A    Number of children: N/A    Years of education: N/A     Occupational History    Not on file.     Social History Main Topics    Smoking status: Not on file    Smokeless tobacco: Not on file    Alcohol use Not on file    Drug use: Unknown    Sexual activity: Not on file     Other Topics Concern    Not on file     Social History Narrative    No narrative on file       PHYSICAL EXAM  Eyes: See Eye Exam  Constitutional: Appears healthy  Psychiatric: Mood normal  Neurologic: Face is symmetric  Skin: Facial rashes:No  Head: Atraumatic  ENT: External ears and nose are normal in appearance .  Respiratory:normal respiratory effort  IMPRESSION / PLAN:  Alternating large angle exotropia. Comitant with full extraocular movements. No concern for underlying neurologic etiology. I discussed indications for surgical repair. She reports she may be interested in the future. However she just recently found out she was pregnant and I would not recommend elective surgery while pregnant. She will contact me if she would like to discuss strabismus surgery further.  Return to clinic as needed.    I saw and evaluated this patient and I have reviewed the resident's note. I agree with the findings and plan as documented. Exceptions/additions: as noted.     A copy of the note was sent to Dr. Massie Kluver E. Thelma Barge, MD  Associate Professor  Neuro-Ophthalmology

## 2017-12-28 NOTE — Progress Notes (Signed)
Chief complaint:     Chief Complaint   Patient presents with    Strabismus     Referred by Dr. Lonn GeorgiaKo for eval of XT       HPI:  Jenna Ramsey is a 37 year old female who was referred by Dr. Lonn GeorgiaKo for strabismus evaluation. Pt reports she has had eye misalignment since she was 37 yo, does endorse occasional but not daily double vision, not bothersome most of the time (but occasionally reads bus numbers incorrectly). Not bothered by appearance. Loosely interested in surgery. No nausea, no HA, no pain with EOMs, does c/o occasional irritation and watering of her eyes.    ROS: Complete review of systems performed, all negative.    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      Ibuprofen 600 MG Oral Tab Take 600 mg by mouth.      Ibuprofen 800 MG Oral Tab       Levonorgestrel (Mirena) 52 mg Intrauterine Device Insert 1 each into the uterus.      Loperamide HCl 2 MG Oral Cap Take 2 mg by mouth.      MetroNIDAZOLE 500 MG Oral Tab TK 1 T PO Q 12 H FOR 7 DAYS  0    Naproxen 250 MG Oral Tab Take 250 mg by mouth.      Ondansetron 4 MG Oral TABLET DISPERSIBLE Dissolve 4-8 mg on top of tongue and swallow.      Ondansetron 8 MG Oral TABLET DISPERSIBLE Dissolve 8 mg on top of tongue and swallow.      RaNITidine HCl 150 MG Oral Tab TK 1 T PO BID FOR 5 DAYS  2    Triamcinolone Acetonide 0.1 % External Ointment Apply 1 application topically.      ValACYclovir HCl 1 g Oral Tab Take  1,000 mg by mouth.       No facility-administered medications prior to visit.        Allergies:    Review of patient's allergies indicates:  Allergies   Allergen Reactions    Bactrim [Sulfamethoxazole-Trimethoprim]     Penicillins        Family History:  Father with eye alignment issues, s/p strabismus surgery  Unknown if family has glaucoma or retinal problems as she is estranged     Social History:   Social History     Social History    Marital status: Single     Spouse name: N/A    Number of children: N/A    Years of education: N/A     Occupational History    Not on file.     Social History Main Topics    Smoking status: Not on file    Smokeless tobacco: Not on file    Alcohol use Not on file    Drug use:  Unknown    Sexual activity: Not on file     Other Topics Concern    Not on file     Social History Narrative    No narrative on file     EtOH: +previous EtOH; denies current or previous tobacco use; recent meth use    PHYSICAL EXAM   Eyes: See Eye Exam   Constitutional: Well-appearing  Psychiatric: Mood normal   Neurologic: Face is symmetric   Skin: No facial rashes  Head: Atraumatic   ENT: External ears and nose are normal in appearance .   Respiratory:normal respiratory effort     Assessment and Plan:    1. Large angle exotropia  - patient with large angle exotropia, largely asymptomatic  - not disturbed by appearance or double vision usually, however interested in surgery  - would defer strabismus surgery while pregnant, can return to clinic PRN for repair    --- not addressed today ---     2. Glaucoma suspect  - follow up with Dr. Lonn Georgia    See Dr. Ardeen Jourdain Note for final A/P    Lilia Pro, MD  [facility] Ophthalmology R2

## 2017-12-29 ENCOUNTER — Emergency Department (HOSPITAL_BASED_OUTPATIENT_CLINIC_OR_DEPARTMENT_OTHER)
Admission: EM | Admit: 2017-12-29 | Discharge: 2017-12-30 | Disposition: A | Discharge status: Home / Self Care | Payer: No Typology Code available for payment source | Attending: Emergency Medicine | Admitting: Emergency Medicine

## 2017-12-29 DIAGNOSIS — A64 Unspecified sexually transmitted disease: Secondary | ICD-10-CM | POA: Insufficient documentation

## 2017-12-29 DIAGNOSIS — R6250 Unspecified lack of expected normal physiological development in childhood: Secondary | ICD-10-CM

## 2017-12-29 LAB — CBC, DIFF
% Basophils: 1 %
% Eosinophils: 1 %
% Immature Granulocytes: 0 %
% Lymphocytes: 40 %
% Monocytes: 8 %
% Neutrophils: 50 %
% Nucleated RBC: 0 %
Absolute Eosinophil Count: 0.06 10*3/uL (ref 0.00–0.50)
Absolute Lymphocyte Count: 3.59 10*3/uL (ref 1.00–4.80)
Basophils: 0.05 10*3/uL (ref 0.00–0.20)
Hematocrit: 42 % (ref 36–45)
Hemoglobin: 14.1 g/dL (ref 11.5–15.5)
Immature Granulocytes: 0.03 10*3/uL (ref 0.00–0.05)
MCH: 28 pg (ref 27.3–33.6)
MCHC: 33.7 g/dL (ref 32.2–36.5)
MCV: 83 fL (ref 81–98)
Monocytes: 0.7 10*3/uL (ref 0.00–0.80)
Neutrophils: 4.48 10*3/uL (ref 1.80–7.00)
Nucleated RBC: 0 10*3/uL
Platelet Count: 334 10*3/uL (ref 150–400)
RBC: 5.04 10*6/uL — ABNORMAL HIGH (ref 3.80–5.00)
RDW-CV: 14.1 % (ref 11.6–14.4)
WBC: 8.91 10*3/uL (ref 4.30–10.00)

## 2017-12-30 ENCOUNTER — Ambulatory Visit (HOSPITAL_BASED_OUTPATIENT_CLINIC_OR_DEPARTMENT_OTHER): Payer: No Typology Code available for payment source

## 2017-12-30 LAB — URINALYSIS WITH REFLEX CULTURE
Bilirubin (Qual), URN: NEGATIVE
Epith Cells_Renal/Trans,URN: NEGATIVE /HPF
Ketones, URN: NEGATIVE mg/dL
Leukocyte Esterase, URN: POSITIVE — AB
Nitrite, URN: NEGATIVE
Protein (Alb Semiquant), URN: NEGATIVE mg/dL
Specific Gravity, URN: 1.001 g/mL — ABNORMAL LOW (ref 1.006–1.027)
pH, URN: 6 (ref 5.0–8.0)

## 2017-12-30 LAB — COMPREHENSIVE METABOLIC PANEL
ALT (GPT): 28 U/L (ref 7–33)
AST (GOT): 28 U/L (ref 9–38)
Albumin: 4.2 g/dL (ref 3.5–5.2)
Alkaline Phosphatase (Total): 107 U/L (ref 25–112)
Anion Gap: 10 (ref 4–12)
Bilirubin (Total): 0.3 mg/dL (ref 0.2–1.3)
Calcium: 9.7 mg/dL (ref 8.9–10.2)
Carbon Dioxide, Total: 24 meq/L (ref 22–32)
Chloride: 103 meq/L (ref 98–108)
Creatinine: 0.81 mg/dL (ref 0.38–1.02)
GFR, Calc, African American: >60 mL/min/{1.73_m2} (ref >59)
GFR, Calc, European American: >60 mL/min/{1.73_m2} (ref >59)
Glucose: 169 mg/dL — ABNORMAL HIGH (ref 62–125)
Potassium: 3.5 meq/L — ABNORMAL LOW (ref 3.6–5.2)
Protein (Total): 7.8 g/dL (ref 6.0–8.2)
Sodium: 137 meq/L (ref 135–145)
Urea Nitrogen: 8 mg/dL (ref 8–21)

## 2017-12-30 LAB — PREGNANCY TEST, URINE
Pregnancy Test, URN: NEGATIVE
Specific Gravity, URN: 1.001 g/mL — ABNORMAL LOW (ref 1.006–1.027)

## 2017-12-30 LAB — TRICHOMONAS NUCLEIC ACID: Trichomonas Nucleic Acid Res.: NEGATIVE

## 2017-12-30 LAB — TRICHOMONAS WET MOUNT W/REFLEX

## 2017-12-30 LAB — LIPASE: Lipase: 61 U/L (ref <70)

## 2017-12-30 LAB — GC&CHLAM NUCLEIC ACID DETECTN
Chlam Trachomatis Nucleic Acid: NEGATIVE
N.Gonorrhoeae(GC) Nucleic Acid: NEGATIVE

## 2017-12-30 LAB — PREGNANCY (HCG), SERUM, QUANT: Pregnancy (HCG), SRM: <1 m[IU]/mL (ref <6)

## 2017-12-30 LAB — REFLEX CULTURE FOR UA

## 2017-12-31 LAB — URINE C/S: Culture: 60000

## 2018-01-07 NOTE — Progress Notes (Signed)
CC: glaucoma evaluation    HISTORY OF PRESENT ILLNESS:  37 year old female presents after referral from [facility] for glaucoma evaluation.  Exotropia and BCVA of 20/25 and 20/30 were also noted on exam.  She notes a long standing h/o diplopia.     01/08/18: Denies new eye complaints; here to repeat HVF    REVIEW OF SYSTEMS:  Comprehensive review of systems was performed.   All systems were negative except as noted in the past medical history and HPI above.    PAST OCULAR HISTORY:  exotropia    PAST MEDICAL HISTORY:  Depression   High cholesterol    Social History     Social History    Marital status: Single     Spouse name: N/A    Number of children: N/A    Years of education: N/A     Occupational History    Not on file.     Social History Main Topics    Smoking status: Not on file    Smokeless tobacco: Not on file    Alcohol use Not on file    Drug use: Unknown    Sexual activity: Not on file     Other Topics Concern    Not on file     Social History Narrative    No narrative on file       FAMILY HISTORY:  Exotropia    OPHTH AND OTHER SELECTED MEDS:  Denies drops    ALLERGY:  Bactrim; Benadryl; PCN; "antibotics" - she doesn't know which ones    PHYSICAL EXAM:   Eyes: See Eye Exam  Constitutional: Notable for the following: Well developed, appearing stated age and in no acute distress  Psychiatric: Mood: normal mood  Neurologic: face is symmetric, gait is independent  Musculoskeletal: head is normocepalic, no skeletal abnormalities of upper extremities  Skin: No facial rashes  ZOX:WRUEAVWU ears and nose are normal in appearance   Respiratory:normal respiratory effort    CVF: I personally performed the CVF and agree with the technician's assessment.  EOMs: I personally performed the EOMs and agree with the technician's assessment.    HVF 24-2 01/08/18  OD:  Quality:   Fair (3/15 fixation losses and 19% FP)  MD:   -0.04 dB  Interpretation:  full     OS:  Quality:   Good  MD:   -0.86  dB  Interpretation:  ?early IA  Interval change: Grossly wnl       HVF 24-2 09/24/17  OD:  Quality:   Poor (3/13 fixation losses)  MD:   -14.13 dB  Interpretation:  Inferior defects; high false positives     OS:  Quality:   Fair (3/10 fixation losses)  MD:   -1.35 dB  Interpretation:  Full but unreliable  Interval change: baseline     Pachymetry 09/24/17  OD: 517 um  OS: 524 um   Thin OU  Interval change: baseline    OCT of OPTIC NERVES 09/24/17  OD:   Centration:  Good  Thinning:  SN, nasal, global  Average thickness: 79 um  OS:  Centration:  Good  Thinning:  TS, temporal, global  Average thickness: 78 um  Interval change: baseline    OCT of macula 09/24/17  Normal foveal contour OU    A/P:    1. Glaucoma suspect due to cupping  Thin corneas  Tmax: 22/23 mm Hg  OCT with thinning both eyes (temporally OS)  IOP check and repeat HVF  in 6 months  Annual testing    Not addressed during this visit:  2. Exotropia  Seen by Dr. Thelma BargeFrancis       Referring:  Criss RosalesMan-Yin  Lau, OD  [facility]  [address]  [place], [place] [id number]    PCP:  Unknown PCP   A Patient Who Has A Pcp But Is Unsure Of The Name

## 2018-01-08 ENCOUNTER — Ambulatory Visit (HOSPITAL_BASED_OUTPATIENT_CLINIC_OR_DEPARTMENT_OTHER): Payer: No Typology Code available for payment source | Attending: Ophthalmology | Admitting: Ophthalmology

## 2018-01-08 ENCOUNTER — Telehealth (HOSPITAL_BASED_OUTPATIENT_CLINIC_OR_DEPARTMENT_OTHER): Payer: Self-pay | Admitting: Ophthalmology

## 2018-01-08 DIAGNOSIS — H5010 Unspecified exotropia: Secondary | ICD-10-CM | POA: Insufficient documentation

## 2018-01-08 DIAGNOSIS — H40003 Preglaucoma, unspecified, bilateral: Secondary | ICD-10-CM

## 2018-01-08 NOTE — Progress Notes (Signed)
HVF 24-2 both eyes, performed in clinic today.

## 2018-01-08 NOTE — Telephone Encounter (Signed)
(  TEXTING IS AN OPTION FOR [facility] CLINICS ONLY)  Is this a [facility] clinic? No      RETURN CALL: General message OK      SUBJECT:  Appointment Request     REASON FOR REQUEST/SYMPTOMS: Discuss surgery  REFERRING PROVIDER: na  REQUEST APPOINTMENT WITH: Marina Goodellourtney Francis  REQUESTED DATE: next available, TIME: afternoon is best  UNABLE TO APPOINT BECAUSE: per SM-- call patient directly, call was placed to us by patient's case manager.

## 2018-01-19 NOTE — Telephone Encounter (Signed)
LVM for Pt to schdl appt. When she calls bk, pls schedule N/A with Dr. Thelma BargeFrancis. Thanks!

## 2018-04-10 ENCOUNTER — Emergency Department (HOSPITAL_BASED_OUTPATIENT_CLINIC_OR_DEPARTMENT_OTHER)
Admission: EM | Admit: 2018-04-10 | Discharge: 2018-04-10 | Disposition: A | Discharge status: Home / Self Care | Payer: No Typology Code available for payment source | Attending: Emergency Medicine | Admitting: Emergency Medicine

## 2018-04-10 ENCOUNTER — Encounter: Payer: Self-pay | Admitting: Emergency Medicine

## 2018-04-10 DIAGNOSIS — N939 Abnormal uterine and vaginal bleeding, unspecified: Secondary | ICD-10-CM | POA: Insufficient documentation

## 2018-04-10 DIAGNOSIS — F22 Delusional disorders: Secondary | ICD-10-CM | POA: Insufficient documentation

## 2018-04-10 DIAGNOSIS — R6250 Unspecified lack of expected normal physiological development in childhood: Secondary | ICD-10-CM

## 2018-04-10 LAB — BASIC METABOLIC PANEL
Anion Gap: 7 (ref 4–12)
Calcium: 9.2 mg/dL (ref 8.9–10.2)
Carbon Dioxide, Total: 27 meq/L (ref 22–32)
Chloride: 104 meq/L (ref 98–108)
Creatinine: 0.78 mg/dL (ref 0.38–1.02)
GFR, Calc, African American: >60 mL/min/{1.73_m2} (ref >59)
GFR, Calc, European American: >60 mL/min/{1.73_m2} (ref >59)
Glucose: 101 mg/dL (ref 62–125)
Potassium: 3.5 meq/L — ABNORMAL LOW (ref 3.6–5.2)
Sodium: 138 meq/L (ref 135–145)
Urea Nitrogen: 7 mg/dL — ABNORMAL LOW (ref 8–21)

## 2018-04-10 LAB — CBC, DIFF
% Basophils: 1 %
% Eosinophils: 1 %
% Immature Granulocytes: 1 %
% Lymphocytes: 36 %
% Monocytes: 9 %
% Neutrophils: 52 %
% Nucleated RBC: 0 %
Absolute Eosinophil Count: 0.05 10*3/uL (ref 0.00–0.50)
Absolute Lymphocyte Count: 2.41 10*3/uL (ref 1.00–4.80)
Basophils: 0.05 10*3/uL (ref 0.00–0.20)
Hematocrit: 43 % (ref 36–45)
Hemoglobin: 14.2 g/dL (ref 11.5–15.5)
Immature Granulocytes: 0.05 10*3/uL (ref 0.00–0.05)
MCH: 27.2 pg — ABNORMAL LOW (ref 27.3–33.6)
MCHC: 32.9 g/dL (ref 32.2–36.5)
MCV: 83 fL (ref 81–98)
Monocytes: 0.61 10*3/uL (ref 0.00–0.80)
Neutrophils: 3.59 10*3/uL (ref 1.80–7.00)
Nucleated RBC: 0 10*3/uL
Platelet Count: 327 10*3/uL (ref 150–400)
RBC: 5.22 10*6/uL — ABNORMAL HIGH (ref 3.80–5.00)
RDW-CV: 14.3 % (ref 11.6–14.4)
WBC: 6.76 10*3/uL (ref 4.30–10.00)

## 2018-04-10 LAB — PROTHROMBIN & PTT
Partial Thromboplastin Time: 27 s (ref 22–35)
Prothrombin INR: 1 (ref 0.8–1.3)
Prothrombin Time Patient: 13.3 s (ref 10.7–15.6)

## 2018-04-10 LAB — PREGNANCY (HCG), SERUM, QUANT: Pregnancy (HCG), SRM: <1 m[IU]/mL (ref <6)

## 2018-04-23 ENCOUNTER — Encounter (HOSPITAL_BASED_OUTPATIENT_CLINIC_OR_DEPARTMENT_OTHER): Payer: No Typology Code available for payment source | Admitting: Obstetrics & Gynecology

## 2018-07-08 ENCOUNTER — Encounter (HOSPITAL_BASED_OUTPATIENT_CLINIC_OR_DEPARTMENT_OTHER): Payer: No Typology Code available for payment source | Admitting: Ophthalmology

## 2018-07-09 ENCOUNTER — Inpatient Hospital Stay: Payer: Self-pay

## 2018-07-09 ENCOUNTER — Encounter (HOSPITAL_BASED_OUTPATIENT_CLINIC_OR_DEPARTMENT_OTHER): Payer: No Typology Code available for payment source | Admitting: Ophthalmology

## 2018-07-13 ENCOUNTER — Encounter (HOSPITAL_BASED_OUTPATIENT_CLINIC_OR_DEPARTMENT_OTHER): Payer: No Typology Code available for payment source | Admitting: Ophthalmology

## 2018-07-28 NOTE — Progress Notes (Deleted)
CC: glaucoma evaluation    HISTORY OF PRESENT ILLNESS:  37 year old female presents after referral from [facility] for glaucoma evaluation.  Exotropia and BCVA of 20/25 and 20/30 were also noted on exam.  She notes a long standing h/o diplopia.     07/29/18: ***        REVIEW OF SYSTEMS:  Comprehensive review of systems was performed.   All systems were negative except as noted in the past medical history and HPI above.    PAST OCULAR HISTORY:  exotropia    PAST MEDICAL HISTORY:  Depression   High cholesterol    Social History     Socioeconomic History    Marital status: Single     Spouse name: Not on file    Number of children: Not on file    Years of education: Not on file    Highest education level: Not on file   Occupational History    Not on file   Social Needs    Financial resource strain: Not on file    Food insecurity:     Worry: Not on file     Inability: Not on file    Transportation needs:     Medical: Not on file     Non-medical: Not on file   Tobacco Use    Smoking status: Not on file   Substance and Sexual Activity    Alcohol use: Not on file    Drug use: Not on file    Sexual activity: Not on file   Lifestyle    Physical activity:     Days per week: Not on file     Minutes per session: Not on file    Stress: Not on file   Relationships    Social connections:     Talks on phone: Not on file     Gets together: Not on file     Attends religious service: Not on file     Active member of club or organization: Not on file     Attends meetings of clubs or organizations: Not on file     Relationship status: Not on file    Intimate partner violence:     Fear of current or ex partner: Not on file     Emotionally abused: Not on file     Physically abused: Not on file     Forced sexual activity: Not on file   Other Topics Concern    Not on file   Social History Narrative    Not on file       FAMILY HISTORY:  Exotropia    OPHTH AND OTHER SELECTED MEDS:  Denies  drops    ALLERGY:  Bactrim; Benadryl; PCN; "antibotics" - she doesn't know which ones    PHYSICAL EXAM:   Eyes: See Eye Exam  Constitutional: Notable for the following: Well developed, appearing stated age and in no acute distress  Psychiatric: Mood: normal mood  Neurologic: face is symmetric, gait is independent  Musculoskeletal: head is normocepalic, no skeletal abnormalities of upper extremities  Skin: No facial rashes  ZOX:WRUEAVWUENT:External ears and nose are normal in appearance   Respiratory:normal respiratory effort    CVF: I personally performed the CVF and agree with the technician's assessment.  EOMs: I personally performed the EOMs and agree with the technician's assessment.    HVF 24-2 01/08/18  OD:  Quality:   Fair (3/15 fixation losses and 19% FP)  MD:   -  0.04 dB  Interpretation:  full     OS:  Quality:   Good  MD:   -0.86 dB  Interpretation:  ?early IA  Interval change: Grossly wnl       HVF 24-2 09/24/17  OD:  Quality:   Poor (3/13 fixation losses)  MD:   -14.13 dB  Interpretation:  Inferior defects; high false positives     OS:  Quality:   Fair (3/10 fixation losses)  MD:   -1.35 dB  Interpretation:  Full but unreliable  Interval change: baseline     Pachymetry 09/24/17  OD: 517 um  OS: 524 um   Thin OU  Interval change: baseline    OCT of OPTIC NERVES 09/24/17  OD:   Centration:  Good  Thinning:  SN, nasal, global  Average thickness: 79 um  OS:  Centration:  Good  Thinning:  TS, temporal, global  Average thickness: 78 um  Interval change: baseline    OCT of macula 09/24/17  Normal foveal contour OU    A/P:    1. Glaucoma suspect due to cupping  Thin corneas  Tmax: 22/23 mm Hg  OCT with thinning both eyes (temporally OS)  IOP check and repeat HVF in 6 months  Annual testing    Not addressed during this visit:  2. Exotropia  Seen by Dr. Thelma BargeFrancis       Referring:  Criss RosalesMan-Yin  Lau, OD  [facility]  [address]  [place], [place] [id number]    PCP:  Unknown PCP   A Patient Who Has A Pcp But Is Unsure Of The Name

## 2018-07-29 ENCOUNTER — Encounter (HOSPITAL_BASED_OUTPATIENT_CLINIC_OR_DEPARTMENT_OTHER): Payer: No Typology Code available for payment source | Admitting: Ophthalmology

## 2018-07-30 ENCOUNTER — Encounter (HOSPITAL_BASED_OUTPATIENT_CLINIC_OR_DEPARTMENT_OTHER): Payer: Self-pay | Admitting: Adult Health

## 2018-07-30 DIAGNOSIS — R102 Pelvic and perineal pain: Secondary | ICD-10-CM

## 2018-08-02 ENCOUNTER — Encounter (HOSPITAL_BASED_OUTPATIENT_CLINIC_OR_DEPARTMENT_OTHER): Payer: No Typology Code available for payment source | Admitting: Ophthalmology

## 2018-08-03 ENCOUNTER — Ambulatory Visit (HOSPITAL_BASED_OUTPATIENT_CLINIC_OR_DEPARTMENT_OTHER): Payer: No Typology Code available for payment source | Attending: Family

## 2018-08-03 DIAGNOSIS — N83292 Other ovarian cyst, left side: Secondary | ICD-10-CM | POA: Insufficient documentation

## 2018-08-03 DIAGNOSIS — R102 Pelvic and perineal pain: Secondary | ICD-10-CM | POA: Insufficient documentation

## 2018-08-12 ENCOUNTER — Other Ambulatory Visit: Payer: Self-pay | Admitting: Internal Medicine

## 2018-08-12 ENCOUNTER — Other Ambulatory Visit: Payer: Self-pay

## 2018-08-12 ENCOUNTER — Emergency Department (HOSPITAL_BASED_OUTPATIENT_CLINIC_OR_DEPARTMENT_OTHER)
Admission: EM | Admit: 2018-08-12 | Discharge: 2018-08-12 | Disposition: A | Discharge status: Home / Self Care | Payer: No Typology Code available for payment source | Attending: Emergency Medicine | Admitting: Emergency Medicine

## 2018-08-12 ENCOUNTER — Encounter (HOSPITAL_BASED_OUTPATIENT_CLINIC_OR_DEPARTMENT_OTHER): Payer: Self-pay | Admitting: Internal Medicine

## 2018-08-12 DIAGNOSIS — N939 Abnormal uterine and vaginal bleeding, unspecified: Secondary | ICD-10-CM | POA: Insufficient documentation

## 2018-08-12 DIAGNOSIS — R0600 Dyspnea, unspecified: Secondary | ICD-10-CM

## 2018-08-12 DIAGNOSIS — R9431 Abnormal electrocardiogram [ECG] [EKG]: Secondary | ICD-10-CM

## 2018-08-12 DIAGNOSIS — F1510 Other stimulant abuse, uncomplicated: Secondary | ICD-10-CM | POA: Insufficient documentation

## 2018-08-12 DIAGNOSIS — F458 Other somatoform disorders: Secondary | ICD-10-CM | POA: Insufficient documentation

## 2018-08-12 DIAGNOSIS — R0689 Other abnormalities of breathing: Secondary | ICD-10-CM

## 2018-08-12 LAB — PROTHROMBIN & PTT
Partial Thromboplastin Time: 26 s (ref 22–35)
Prothrombin INR: 1.1 (ref 0.8–1.3)
Prothrombin Time Patient: 13.9 s (ref 10.7–15.6)

## 2018-08-12 LAB — PREGNANCY TEST, URINE
Pregnancy Test, URN: NEGATIVE
Specific Gravity, URN: 1.007 g/mL (ref 1.006–1.027)

## 2018-08-12 LAB — URINALYSIS WITH REFLEX CULTURE
Bilirubin (Qual), URN: NEGATIVE
Epith Cells_Renal/Trans,URN: NEGATIVE /HPF
Glucose Qual, URN: NEGATIVE mg/dL
Ketones, URN: NEGATIVE mg/dL
Leukocyte Esterase, URN: POSITIVE — AB
Nitrite, URN: NEGATIVE
RBC, URN: >20 /HPF — AB
Specific Gravity, URN: 1.007 g/mL (ref 1.006–1.027)
WBC, URN: NEGATIVE /HPF
pH, URN: 7 (ref 5.0–8.0)

## 2018-08-12 LAB — CBC (HEMOGRAM)
Hematocrit: 41 % (ref 36–45)
Hemoglobin: 13.5 g/dL (ref 11.5–15.5)
MCH: 27.1 pg — ABNORMAL LOW (ref 27.3–33.6)
MCHC: 32.8 g/dL (ref 32.2–36.5)
MCV: 82 fL (ref 81–98)
Platelet Count: 300 10*3/uL (ref 150–400)
RBC: 4.99 10*6/uL (ref 3.80–5.00)
RDW-CV: 14.6 % — ABNORMAL HIGH (ref 11.6–14.4)
WBC: 8.52 10*3/uL (ref 4.30–10.00)

## 2018-08-12 LAB — BASIC METABOLIC PANEL
Anion Gap: 8 (ref 4–12)
Calcium: 9.1 mg/dL (ref 8.9–10.2)
Carbon Dioxide, Total: 25 meq/L (ref 22–32)
Chloride: 103 meq/L (ref 98–108)
Creatinine: 0.83 mg/dL (ref 0.38–1.02)
GFR, Calc, African American: >60 mL/min/{1.73_m2} (ref >59)
GFR, Calc, European American: >60 mL/min/{1.73_m2} (ref >59)
Glucose: 145 mg/dL — ABNORMAL HIGH (ref 62–125)
Potassium: 3.4 meq/L — ABNORMAL LOW (ref 3.6–5.2)
Sodium: 136 meq/L (ref 135–145)
Urea Nitrogen: 8 mg/dL (ref 8–21)

## 2018-08-12 LAB — HEPATITIS C AB WITH REFLEX PCR: Hepatitis C Antibody w/Rflx PCR: NONREACTIVE

## 2018-08-12 LAB — CK, CREATINE KINASE, TOTAL ACTIVITY: Creatine Kinase Total Activity: 147 U/L (ref 43–274)

## 2018-08-12 LAB — REFLEX CULTURE FOR UA

## 2018-08-12 LAB — HIV ANTIGEN AND ANTIBODY SCRN
HIV Antigen and Antibody Interpretation: NONREACTIVE
HIV Antigen and Antibody Result: NONREACTIVE

## 2018-08-13 LAB — GC&CHLAM NUCLEIC ACID DETECTN
Chlam Trachomatis Nucleic Acid: POSITIVE — AB
N.Gonorrhoeae(GC) Nucleic Acid: NEGATIVE

## 2018-08-13 LAB — URINE C/S: Culture: >100000

## 2018-08-13 LAB — EKG 12 LEAD
Atrial Rate: 77 {beats}/min
Diagnosis: NORMAL
P Axis: 30 degrees
P-R Interval: 176 ms
Q-T Interval: 384 ms
QRS Duration: 80 ms
QTC Calculation: 434 ms
R Axis: 16 degrees
T Axis: 0 degrees
Ventricular Rate: 77 {beats}/min

## 2018-08-30 ENCOUNTER — Ambulatory Visit (HOSPITAL_BASED_OUTPATIENT_CLINIC_OR_DEPARTMENT_OTHER): Payer: No Typology Code available for payment source

## 2018-09-01 ENCOUNTER — Ambulatory Visit (HOSPITAL_BASED_OUTPATIENT_CLINIC_OR_DEPARTMENT_OTHER): Payer: No Typology Code available for payment source | Attending: Internal Medicine | Admitting: Internal Medicine

## 2018-09-01 VITALS — BP 118/66 | HR 80 | Temp 98.2°F | Resp 16 | Ht 59.0 in | Wt 142.0 lb

## 2018-09-01 DIAGNOSIS — Z6828 Body mass index (BMI) 28.0-28.9, adult: Secondary | ICD-10-CM

## 2018-09-01 DIAGNOSIS — A749 Chlamydial infection, unspecified: Secondary | ICD-10-CM

## 2018-09-01 NOTE — Progress Notes (Signed)
[facility] AFTERCARE CLINIC  OUTPATIENT NOTE    DATE: 09/01/2018    ID/CHIEF COMPLAINT:  Jenna Ramsey is a 37 year old English speaking female who presents today for follow up after ED visit.     INTERVAL HISTORY/HPI:  Jenna Ramsey was seen in the [facility] ED on 08/12/2018 with complaint of one day of neck tightness, onset after smoking methamphetamine. She also reported vaginal bleeding. Exam was unremarkable. UA with 4+ RBCs on microscopy. Urine NAAT testing later returned positive for chlamydia. She was called and referred [facility]. HIV and HCV were negative. She was scheduled for follow up today in [facility]. She had a transvaginal ultrasound ordered by her PCP performed a week prior that was normal.     Review of Care Everywhere shoes multiple recurrent ED visits, often out of concern for pregnancy with negative HCG testing.     The patient is rather tangential in her reponses today, and history is difficult as a result.     She reports that she has a long-time PCP at [facility] (Dr. [place]?) whom she saw a few weeks ago and with whom she has an upcoming appointment.     She reports that she got the medicine for chlamydia from the STD clinic here and took it (2 pills). Last week. She's still having some itchiness in her vagina and maybe some discharge. She is declining examination today.      Her neck is feeling better.     She has been reading about the harmful effects of mixing methamphetamine and alcohol. She stopped using the meth just recently , by report.     She reported chest pains, ear pains, that she connected to meth use.      She is not using birth control. Her boyfriend told her he wwas treated for chlamydia, but she's not sure.     She repeatedly voiced concern that her "head is shrinking" and that methamphetamine has "eaten away at my brain."    PAST MEDICAL HISTORY:  Patient Active Problem List   Diagnosis    Glaucoma suspect of both eyes       MEDICATIONS:  Current Outpatient Medications    Medication Sig Dispense Refill    Acetaminophen 500 MG Oral Tab Take 1,000 mg by mouth.      Ibuprofen 600 MG Oral Tab Take 600 mg by mouth.      Ibuprofen 800 MG Oral Tab       Levonorgestrel (Mirena) 52 mg Intrauterine Device Insert 1 each into the uterus.      Loperamide HCl 2 MG Oral Cap Take 2 mg by mouth.      MetroNIDAZOLE 500 MG Oral Tab TK 1 T PO Q 12 H FOR 7 DAYS  0    Naproxen 250 MG Oral Tab Take 250 mg by mouth.      Ondansetron 4 MG Oral TABLET DISPERSIBLE Dissolve 4-8 mg on top of tongue and swallow.      Ondansetron 8 MG Oral TABLET DISPERSIBLE Dissolve 8 mg on top of tongue and swallow.      RaNITidine HCl 150 MG Oral Tab TK 1 T PO BID FOR 5 DAYS  2    Triamcinolone Acetonide 0.1 % External Ointment Apply 1 application topically.      ValACYclovir HCl 1 g Oral Tab Take 1,000 mg by mouth.       No current facility-administered medications for this visit.  ALLERGIES:  Review of patient's allergies indicates:  Allergies   Allergen Reactions    Bactrim [Sulfamethoxazole-Trimethoprim]     Penicillins      PHYSICAL EXAM:  BP 118/66    Pulse 80    Temp 98.2 F (36.8 C) (Temporal)    Resp 16    Ht 4\' 11"  (1.499 m)    Wt 142 lb (64.4 kg)    SpO2 98%    BMI 28.68 kg/m     GEN: NAD  HEENT: Dyscongegate gaze.   SKIN: No rashes, no nail changes  CV: RRR, Normal S1 & S2, with no R/M/G. No JVD noted. No clubbing, cyanosis, or edema.  RESP: Easy work of breathing. CTAB, without crackles, wheezes, or rhonchi  GU: Patient declined.   PSYCH:   Tangentiality with some delusional content. Does not seem to be responding to internal stimuli.   Neuro: Alert and fully oriented, normal gait.     RELEVANT STUDIES  Results for orders placed or performed in visit on 08/12/18   EKG 12-LEAD   Result Value Ref Range    Ventricular Rate 77 BPM    Atrial Rate 77 BPM    P-R Interval 176 ms    QRS Duration 80 ms    Q-T Interval 384 ms    QTC Calculation 434 ms    P Axis 30 degrees    R Axis 16 degrees    T Axis  0 degrees    Diagnosis       Normal sinus rhythm with sinus arrhythmia  Nonspecific T wave abnormality  Abnormal ECG  No previous ECGs available  Confirmed by KIM M.D., Thelma BargeFRANCIS [phone]([id number]) on 08/13/2018 11:22:39 AM       ASSESSMENT AND PLAN:  37 year old English speaking female who presents today for follow up after ED visit.     # Chlamydia trachomatis: Diagnosed in the ED and treated on 9/3, per patient. Patient still reports some discharge but declines exam. Given this, I've recommended test for cure, which must be performed more than three weeks after treatment.  - NAAT GC testing in 1 week.    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   - I provided support of her decision to abstain and recommended she seek help if it proves more difficult than she anticipates.    # Mental health: Tangentiality and delusion apparent on interview today. Unclear mental health history. Will defer diagnosis and management to PCP.    # Neck Pain: Resolved.    # Vaginal bleeding: Resolved. Recent transvaginal ultrasound was normal.    # Primary Care and Follow up: [facility]

## 2018-09-01 NOTE — Patient Instructions (Signed)
Thanks for coming today.    Please come back in a week to get tested for cure of chlamydia. Please make sure your boyfriend has gotten tested and treated.    Good job stopping meth. It's one of the best things you can do for your health. If it ends up being hard to stay away from, let your doctor know.    [facility]

## 2018-10-03 ENCOUNTER — Other Ambulatory Visit: Payer: Self-pay | Admitting: Unknown Physician Specialty

## 2018-10-03 ENCOUNTER — Emergency Department (HOSPITAL_BASED_OUTPATIENT_CLINIC_OR_DEPARTMENT_OTHER)
Admission: EM | Admit: 2018-10-03 | Discharge: 2018-10-03 | Disposition: A | Discharge status: Home / Self Care | Payer: No Typology Code available for payment source | Attending: Emergency Medical Services | Admitting: Emergency Medical Services

## 2018-10-03 ENCOUNTER — Other Ambulatory Visit: Payer: Self-pay

## 2018-10-03 DIAGNOSIS — R079 Chest pain, unspecified: Secondary | ICD-10-CM

## 2018-10-03 DIAGNOSIS — F1590 Other stimulant use, unspecified, uncomplicated: Secondary | ICD-10-CM

## 2018-10-03 DIAGNOSIS — R0789 Other chest pain: Secondary | ICD-10-CM | POA: Insufficient documentation

## 2018-10-03 DIAGNOSIS — Z590 Homelessness: Secondary | ICD-10-CM

## 2018-10-03 LAB — URINALYSIS WITH REFLEX CULTURE
Bacteria, URN: NONE SEEN
Bilirubin (Qual), URN: NEGATIVE
Epith Cells_Renal/Trans,URN: NEGATIVE /HPF
Glucose Qual, URN: NEGATIVE mg/dL
Ketones, URN: NEGATIVE mg/dL
Leukocyte Esterase, URN: NEGATIVE
Nitrite, URN: NEGATIVE
Protein (Alb Semiquant), URN: NEGATIVE mg/dL
RBC, URN: NEGATIVE /HPF
Specific Gravity, URN: 1.009 g/mL (ref 1.006–1.027)
WBC, URN: NEGATIVE /HPF
pH, URN: 6.5 (ref 5.0–8.0)

## 2018-10-03 LAB — LAB ADD ON ORDER

## 2018-10-03 LAB — STANDARD DRUG SCREEN, URN
Acetaminophen Qualitative, URN: NEGATIVE
Alcohol (Ethyl), URN: NEGATIVE mg/dL
Amphet/Methamphetamine Qual,URN: POSITIVE — AB
Barbiturate (Qual), URN: NEGATIVE
Benzodiazepines (Qual), URN: NEGATIVE
Cannabinoids (Qual), URN: NEGATIVE
Cocaine (Qual), URN: NEGATIVE
Methadone (Qual), URN: NEGATIVE
Opiates (Qual), URN: NEGATIVE
Phencyclidine (Qual), URN: NEGATIVE
Tricyclic Antidepressants, URN: NEGATIVE

## 2018-10-03 LAB — PREGNANCY TEST, URINE
Pregnancy Test, URN: NEGATIVE
Specific Gravity, URN: 1.009 g/mL (ref 1.006–1.027)

## 2018-10-03 LAB — REFLEX CULTURE FOR UA

## 2018-10-04 LAB — EKG 12 LEAD
Atrial Rate: 78 {beats}/min
Diagnosis: NORMAL
P Axis: 32 degrees
P-R Interval: 174 ms
Q-T Interval: 392 ms
QRS Duration: 72 ms
QTC Calculation: 446 ms
R Axis: 24 degrees
T Axis: 7 degrees
Ventricular Rate: 78 {beats}/min

## 2018-10-04 LAB — URINE C/S: Culture: 11000

## 2018-11-23 ENCOUNTER — Other Ambulatory Visit (HOSPITAL_BASED_OUTPATIENT_CLINIC_OR_DEPARTMENT_OTHER): Payer: Self-pay | Admitting: Cardiovascular Disease

## 2018-11-23 DIAGNOSIS — R079 Chest pain, unspecified: Secondary | ICD-10-CM

## 2018-11-23 NOTE — Progress Notes (Signed)
Per Dr. Chen (VORV) we will order a TME for chest pain, there are no medications to hold.

## 2018-12-02 ENCOUNTER — Encounter (HOSPITAL_BASED_OUTPATIENT_CLINIC_OR_DEPARTMENT_OTHER): Payer: Self-pay | Admitting: Family Practice

## 2018-12-02 ENCOUNTER — Other Ambulatory Visit (HOSPITAL_BASED_OUTPATIENT_CLINIC_OR_DEPARTMENT_OTHER): Payer: No Typology Code available for payment source

## 2018-12-02 DIAGNOSIS — R221 Localized swelling, mass and lump, neck: Secondary | ICD-10-CM

## 2018-12-23 ENCOUNTER — Other Ambulatory Visit (HOSPITAL_BASED_OUTPATIENT_CLINIC_OR_DEPARTMENT_OTHER): Payer: No Typology Code available for payment source

## 2019-01-25 ENCOUNTER — Emergency Department (HOSPITAL_BASED_OUTPATIENT_CLINIC_OR_DEPARTMENT_OTHER)
Admission: EM | Admit: 2019-01-25 | Discharge: 2019-01-26 | Disposition: A | Discharge status: Home / Self Care | Payer: No Typology Code available for payment source | Attending: Emergency Medicine | Admitting: Emergency Medicine

## 2019-01-25 DIAGNOSIS — F29 Unspecified psychosis not due to a substance or known physiological condition: Secondary | ICD-10-CM | POA: Insufficient documentation

## 2019-01-25 DIAGNOSIS — F419 Anxiety disorder, unspecified: Secondary | ICD-10-CM | POA: Insufficient documentation

## 2019-01-25 DIAGNOSIS — F329 Major depressive disorder, single episode, unspecified: Secondary | ICD-10-CM | POA: Insufficient documentation

## 2019-01-25 LAB — COMPREHENSIVE METABOLIC PANEL
ALT (GPT): 65 U/L — ABNORMAL HIGH (ref 7–33)
AST (GOT): 28 U/L (ref 9–38)
Albumin: 4 g/dL (ref 3.5–5.2)
Alkaline Phosphatase (Total): 99 U/L (ref 25–112)
Anion Gap: 7 (ref 4–12)
Bilirubin (Total): 0.5 mg/dL (ref 0.2–1.3)
Calcium: 9.2 mg/dL (ref 8.9–10.2)
Carbon Dioxide, Total: 27 meq/L (ref 22–32)
Chloride: 104 meq/L (ref 98–108)
Creatinine: 0.82 mg/dL (ref 0.38–1.02)
GFR, Calc, African American: >60 mL/min/{1.73_m2} (ref >59)
GFR, Calc, European American: >60 mL/min/{1.73_m2} (ref >59)
Glucose: 134 mg/dL — ABNORMAL HIGH (ref 62–125)
Potassium: 3.8 meq/L (ref 3.6–5.2)
Protein (Total): 7.3 g/dL (ref 6.0–8.2)
Sodium: 138 meq/L (ref 135–145)
Urea Nitrogen: 11 mg/dL (ref 8–21)

## 2019-01-25 LAB — CBC (HEMOGRAM)
Hematocrit: 40 % (ref 36–45)
Hemoglobin: 13.7 g/dL (ref 11.5–15.5)
MCH: 27.6 pg (ref 27.3–33.6)
MCHC: 34.3 g/dL (ref 32.2–36.5)
MCV: 80 fL — ABNORMAL LOW (ref 81–98)
Platelet Count: 339 10*3/uL (ref 150–400)
RBC: 4.96 10*6/uL (ref 3.80–5.00)
RDW-CV: 13.9 % (ref 11.6–14.4)
WBC: 9.55 10*3/uL (ref 4.30–10.00)

## 2019-01-26 LAB — LAB ADD ON ORDER

## 2019-01-26 LAB — PREGNANCY (HCG), SERUM, QUANT: Pregnancy (HCG), SRM: <1 m[IU]/mL (ref <6)

## 2019-07-14 ENCOUNTER — Inpatient Hospital Stay: Payer: Self-pay

## 2019-12-21 NOTE — Progress Notes (Deleted)
CC: glaucoma evaluation    HISTORY OF PRESENT ILLNESS:  39 year old female presents after referral from [employer] for glaucoma evaluation.  Exotropia and BCVA of 20/25 and 20/30 were also noted on exam.  She notes a long standing h/o diplopia.     01/08/18: Denies new eye complaints; here to repeat HVF    12/22/19: ***      REVIEW OF SYSTEMS:  Comprehensive review of systems was performed.   All systems were negative except as noted in the past medical history and HPI above.    PAST OCULAR HISTORY:  exotropia    PAST MEDICAL HISTORY:  Depression   High cholesterol    Social History     Socioeconomic History   . Marital status: Single     Spouse name: Not on file   . Number of children: Not on file   . Years of education: Not on file   . Highest education level: Not on file   Occupational History   . Not on file   Social Needs   . Financial resource strain: Not on file   . Food insecurity     Worry: Not on file     Inability: Not on file   . Transportation needs     Medical: Not on file     Non-medical: Not on file   Tobacco Use   . Smoking status: Not on file   Substance and Sexual Activity   . Alcohol use: Not on file   . Drug use: Not on file   . Sexual activity: Not on file   Lifestyle   . Physical activity     Days per week: Not on file     Minutes per session: Not on file   . Stress: Not on file   Relationships   . Social Product manager on phone: Not on file     Gets together: Not on file     Attends religious service: Not on file     Active member of club or organization: Not on file     Attends meetings of clubs or organizations: Not on file     Relationship status: Not on file   . Intimate partner violence     Fear of current or ex partner: Not on file     Emotionally abused: Not on file     Physically abused: Not on file     Forced sexual activity: Not on file   Other Topics Concern   . Not on file   Social History Narrative   . Not on file       FAMILY HISTORY:  Exotropia    OPHTH AND  OTHER SELECTED MEDS:  Denies drops    ALLERGY:  Bactrim; Benadryl; PCN; "antibotics" - she doesn't know which ones    PHYSICAL EXAM:   Eyes: See Eye Exam  Constitutional: Notable for the following: Well developed, appearing stated age and in no acute distress  Psychiatric: Mood: normal mood  Neurologic: face is symmetric, gait is independent  Musculoskeletal: head is normocepalic, no skeletal abnormalities of upper extremities  Skin: No facial rashes  HCW:CBJSEGBT ears and nose are normal in appearance   Respiratory:normal respiratory effort    CVF: I personally performed the CVF and agree with the technician's assessment.  EOMs: I personally performed the EOMs and agree with the technician's assessment.    RNFL 12/22/19  OD:   Centration:  {GOOD/FAIR/POOR:100248}  Thinning:  None/***  Average thickness: *** um  OS:  Centration:  {GOOD/FAIR/POOR:100248}  Thinning:  None/***  Average thickness: *** um  Interval change: ***    HVF 24-2 01/08/18  OD:  Quality:   Fair (3/15 fixation losses and 19% FP)  MD:   -0.04 dB  Interpretation:  full     OS:  Quality:   Good  MD:   -0.86 dB  Interpretation:  ?early IA  Interval change: Grossly wnl     HVF 24-2 09/24/17  OD:  Quality:   Poor (3/13 fixation losses)  MD:   -14.13 dB  Interpretation:  Inferior defects; high false positives     OS:  Quality:   Fair (3/10 fixation losses)  MD:   -1.35 dB  Interpretation:  Full but unreliable  Interval change: baseline     Pachymetry 09/24/17  OD: 517 um  OS: 524 um   Thin OU  Interval change: baseline    OCT of OPTIC NERVES 09/24/17  OD:   Centration:  Good  Thinning:  SN, nasal, global  Average thickness: 79 um  OS:  Centration:  Good  Thinning:  TS, temporal, global  Average thickness: 78 um  Interval change: baseline    OCT of macula 09/24/17  Normal foveal contour OU    A/P:    1. Glaucoma suspect due to cupping  Thin corneas  Tmax: 22/23 mm Hg  OCT with thinning both eyes (temporally OS)  IOP check and repeat HVF in 6  months    Not addressed during this visit:  2. Exotropia  Seen by Dr. Thelma Barge       Referring:  Criss Rosales, OD  [facility]  [address]  [place], [place] [address]    PCP:  Unknown PCP   A Patient Who Has A Pcp But Is Unsure Of The Name

## 2019-12-22 ENCOUNTER — Encounter (HOSPITAL_BASED_OUTPATIENT_CLINIC_OR_DEPARTMENT_OTHER): Payer: No Typology Code available for payment source | Admitting: Ophthalmology

## 2020-06-07 ENCOUNTER — Telehealth (HOSPITAL_BASED_OUTPATIENT_CLINIC_OR_DEPARTMENT_OTHER): Payer: Self-pay | Admitting: Ophthalmology

## 2020-06-07 NOTE — Telephone Encounter (Signed)
Called pt and left VM cancelling Dr. Lonn Georgia appt as pt will not be available.     When pt calls back please schedule 7/8 or 7/29. These days were opened for these reschedules specifically.

## 2020-07-02 ENCOUNTER — Encounter (HOSPITAL_BASED_OUTPATIENT_CLINIC_OR_DEPARTMENT_OTHER): Payer: No Typology Code available for payment source | Admitting: Ophthalmology

## 2021-07-31 NOTE — Progress Notes (Signed)
Existing patient assigned by [facility] - insurance company to [facility] to receive medical care.     Outreach to schedule appointment for routine primary care visit, chronic condition f/u, or CPE/WCC.     LVM for Pt to cb & schedule OR to let us know if have transferred care.      [facility]- If they call back and report they have transferred care, please send task to [id number] TEAM so we can update the [facility] list.     Thanks,               Liliane Bade Y-POET

## 2022-11-14 DEATH — deceased
